# Patient Record
Sex: Female | Born: 1995 | Race: Black or African American | Hispanic: No | Smoking: Never smoker
Health system: Southern US, Community
[De-identification: ages and names within clinical notes are randomized; demographics above are authoritative.]

## PROBLEM LIST (undated history)

## (undated) DIAGNOSIS — Z789 Other specified health status: Secondary | ICD-10-CM

---

## 2020-03-02 ENCOUNTER — Emergency Department (HOSPITAL_COMMUNITY)
Admission: EM | Admit: 2020-03-02 | Discharge: 2020-03-03 | Disposition: A | Discharge status: Home / Self Care | Payer: Self-pay | Attending: Emergency Medicine | Admitting: Emergency Medicine

## 2020-03-02 ENCOUNTER — Other Ambulatory Visit: Payer: Self-pay

## 2020-03-02 DIAGNOSIS — R55 Syncope and collapse: Secondary | ICD-10-CM | POA: Insufficient documentation

## 2020-03-02 DIAGNOSIS — R1030 Lower abdominal pain, unspecified: Secondary | ICD-10-CM | POA: Insufficient documentation

## 2020-03-02 DIAGNOSIS — T61781A Other shellfish poisoning, accidental (unintentional), initial encounter: Secondary | ICD-10-CM

## 2020-03-02 DIAGNOSIS — R252 Cramp and spasm: Secondary | ICD-10-CM | POA: Insufficient documentation

## 2020-03-02 DIAGNOSIS — T782XXA Anaphylactic shock, unspecified, initial encounter: Secondary | ICD-10-CM | POA: Insufficient documentation

## 2020-03-02 MED ORDER — LACTATED RINGERS IV BOLUS
1000.0000 mL | Freq: Once | INTRAVENOUS | Status: AC
  Administered 2020-03-03: 1000 mL via INTRAVENOUS

## 2020-03-02 NOTE — ED Triage Notes (Signed)
Patient was transported here by EMS from a [place]. Started dinner had a salad, rice, and shrimp. Suddenly felt sick/ hot/numb. Near syncope. Patient was ill and reported by EMS very pale. Reports of abdominal pain and cramping.   -no hx of  allergies or any other medical conditions.  EMS: 20 gauge IV Lhand.  500cc N/S .3 epi 50mg  benadryl

## 2020-03-02 NOTE — ED Provider Notes (Signed)
[facility] Provider Note   CSN: [id number] Arrival date & time: 03/02/20  2300     History Chief Complaint  Patient presents with  . Allergic Reaction  . Near Syncope    Anna [name] is a 24 y.o. female.  HPI 24 year old female presents with near syncope.  History is via EMS and the patient.  She was eating at a [employer] where she was having shrimp and rice, to which she has no known allergies, and she started to feel hot and numb all over.  She felt like she was going to pass out.  Bystander states she did though she does not think so.  She never had a headache, chest pain, shortness of breath.  When in the ambulance she was having some abdominal cramping and was hypotensive in the 90s.  They gave her 50 mg IV Benadryl and 0.3 mg IM epi.  She is feeling better now. Denies any preceding illness or and significant PMH. No lip/tongue swelling or rash.  No past medical history on file.  There are no problems to display for this patient.      OB History   No obstetric history on file.     No family history on file.  Social History   Tobacco Use  . Smoking status: Not on file  Substance Use Topics  . Alcohol use: Not on file  . Drug use: Not on file    Home Medications Prior to Admission medications   Not on File    Allergies    Patient has no known allergies.  Review of Systems   Review of Systems  Respiratory: Negative for shortness of breath.   Cardiovascular: Negative for chest pain.  Gastrointestinal: Positive for abdominal pain. Negative for diarrhea and vomiting.  Skin: Negative for rash.  Neurological: Positive for light-headedness. Negative for syncope and headaches.  All other systems reviewed and are negative.   Physical Exam Updated Vital Signs BP 104/78   Pulse (!) 117   Temp 97.7 F (36.5 C) (Oral)   Resp (!) 24   Ht 4\' 11"  (1.499 m)   Wt 86.2 kg   SpO2 100%   BMI 38.38 kg/m   Physical  Exam Vitals and nursing note reviewed.  Constitutional:      General: She is not in acute distress.    Appearance: She is well-developed. She is obese. She is not ill-appearing or diaphoretic.  HENT:     Head: Normocephalic and atraumatic.     Right Ear: External ear normal.     Left Ear: External ear normal.     Nose: Nose normal.     Mouth/Throat:     Comments: No lip/tongue swelling.  Eyes:     General:        Right eye: No discharge.        Left eye: No discharge.     Pupils: Pupils are equal, round, and reactive to light.  Cardiovascular:     Rate and Rhythm: Regular rhythm. Tachycardia present.     Heart sounds: Normal heart sounds.  Pulmonary:     Effort: Pulmonary effort is normal.     Breath sounds: Normal breath sounds.  Abdominal:     Palpations: Abdomen is soft.     Tenderness: There is no abdominal tenderness.  Skin:    General: Skin is warm and dry.     Findings: No rash.  Neurological:     Mental Status: She is alert.  Comments: CN 3-12 grossly intact. 5/5 strength in all 4 extremities. Grossly normal sensation. Normal finger to nose.   Psychiatric:        Mood and Affect: Mood is not anxious.     ED Results / Procedures / Treatments   Labs (all labs ordered are listed, but only abnormal results are displayed) Labs Reviewed  COMPREHENSIVE METABOLIC PANEL - Abnormal; Notable for the following components:      Result Value   CO2 19 (*)    Glucose, Bld 115 (*)    Calcium 8.3 (*)    All other components within normal limits  CBC WITH DIFFERENTIAL/PLATELET  I-STAT BETA HCG BLOOD, ED ([facility], [facility], [facility] ONLY)    EKG EKG Interpretation  Date/Time:  Friday March 02 2020 23:51:09 EDT Ventricular Rate:  107 PR Interval:    QRS Duration: 83 QT Interval:  355 QTC Calculation: 474 R Axis:   1 Text Interpretation: Sinus tachycardia no acute ST/T changes No old tracing to compare Confirmed by Pricilla Loveless [phone]) on 03/03/2020 12:00:01 AM   Radiology No  results found.  Procedures Procedures (including critical care time)  Medications Ordered in ED Medications  lactated ringers bolus 1,000 mL (1,000 mLs Intravenous New Bag/Given 03/03/20 0001)    ED Course  I have reviewed the triage vital signs and the nursing notes.  Pertinent labs & imaging results that were available during my care of the patient were reviewed by me and considered in my medical decision making (see chart for details).    MDM Rules/Calculators/A&P                          Patient seems to have had a reaction to the seafood.  I doubt this was truly allergic but more likely toxin mediated.  After EMS treatment and then IV fluids in the emergency department and observation she is now feeling well enough for discharge.  She eats shrimp all the time according to her so I do not think this was truly anaphylaxis.  Will recommend continued Benadryl.  BP is now normal and her heart rate on my exam is in the 90s.  CBC with mild anemia but I doubt this was the cause of her near syncope episode.  She has a low bicarbonate but with normal anion gap I think this is unlikely to be pathologic at this time.  She appears stable for discharge.  Will give EpiPen given the degree of her reaction but again I do not think this is necessarily anaphylaxis. Final Clinical Impression(s) / ED Diagnoses Final diagnoses:  Accidental poisoning by shellfish, initial encounter    Rx / DC Orders ED Discharge Orders    None       Pricilla Loveless, MD 03/03/20 [phone]

## 2020-03-03 LAB — CBC WITH DIFFERENTIAL/PLATELET
Abs Immature Granulocytes: 0.02 10*3/uL (ref 0.00–0.07)
Basophils Absolute: 0.1 10*3/uL (ref 0.0–0.1)
Basophils Relative: 1 %
Eosinophils Absolute: 0.1 10*3/uL (ref 0.0–0.5)
Eosinophils Relative: 1 %
HCT: 33.4 % — ABNORMAL LOW (ref 36.0–46.0)
Hemoglobin: 11.1 g/dL — ABNORMAL LOW (ref 12.0–15.0)
Immature Granulocytes: 0 %
Lymphocytes Relative: 33 %
Lymphs Abs: 2.9 10*3/uL (ref 0.7–4.0)
MCH: 27.8 pg (ref 26.0–34.0)
MCHC: 33.2 g/dL (ref 30.0–36.0)
MCV: 83.7 fL (ref 80.0–100.0)
Monocytes Absolute: 1 10*3/uL (ref 0.1–1.0)
Monocytes Relative: 11 %
Neutro Abs: 4.9 10*3/uL (ref 1.7–7.7)
Neutrophils Relative %: 54 %
Platelets: 281 10*3/uL (ref 150–400)
RBC: 3.99 MIL/uL (ref 3.87–5.11)
RDW: 17 % — ABNORMAL HIGH (ref 11.5–15.5)
WBC: 8.9 10*3/uL (ref 4.0–10.5)
nRBC: 0 % (ref 0.0–0.2)

## 2020-03-03 LAB — COMPREHENSIVE METABOLIC PANEL
ALT: 24 U/L (ref 0–44)
AST: 27 U/L (ref 15–41)
Albumin: 3.8 g/dL (ref 3.5–5.0)
Alkaline Phosphatase: 61 U/L (ref 38–126)
Anion gap: 12 (ref 5–15)
BUN: 13 mg/dL (ref 6–20)
CO2: 19 mmol/L — ABNORMAL LOW (ref 22–32)
Calcium: 8.3 mg/dL — ABNORMAL LOW (ref 8.9–10.3)
Chloride: 107 mmol/L (ref 98–111)
Creatinine, Ser: 1 mg/dL (ref 0.44–1.00)
GFR calc Af Amer: >60 mL/min (ref >60)
GFR calc non Af Amer: >60 mL/min (ref >60)
Glucose, Bld: 115 mg/dL — ABNORMAL HIGH (ref 70–99)
Potassium: 3.9 mmol/L (ref 3.5–5.1)
Sodium: 138 mmol/L (ref 135–145)
Total Bilirubin: 0.6 mg/dL (ref 0.3–1.2)
Total Protein: 7.5 g/dL (ref 6.5–8.1)

## 2020-03-03 LAB — I-STAT BETA HCG BLOOD, ED (MC, WL, AP ONLY): I-stat hCG, quantitative: <5 m[IU]/mL (ref <5)

## 2020-03-03 MED ORDER — FAMOTIDINE IN NACL 20-0.9 MG/50ML-% IV SOLN: 20.0000 mg | Freq: Once | INTRAVENOUS | Status: DC

## 2020-03-03 MED ORDER — DIPHENHYDRAMINE HCL 25 MG PO TABS: 50.0000 mg | ORAL_TABLET | Freq: Four times a day (QID) | ORAL | 0 refills | Status: DC | PRN

## 2020-03-03 MED ORDER — EPINEPHRINE 0.3 MG/0.3ML IJ SOAJ: 0.3000 mg | INTRAMUSCULAR | 0 refills | Status: DC | PRN

## 2021-02-05 ENCOUNTER — Inpatient Hospital Stay (HOSPITAL_COMMUNITY)
Admission: EM | Admit: 2021-02-05 | Discharge: 2021-02-14 | DRG: 286 | Disposition: A | Discharge status: Home / Self Care | Payer: Self-pay | Attending: Internal Medicine | Admitting: Internal Medicine

## 2021-02-05 ENCOUNTER — Emergency Department (HOSPITAL_COMMUNITY): Payer: Self-pay

## 2021-02-05 ENCOUNTER — Encounter (HOSPITAL_COMMUNITY): Payer: Self-pay

## 2021-02-05 ENCOUNTER — Other Ambulatory Visit: Payer: Self-pay

## 2021-02-05 ENCOUNTER — Inpatient Hospital Stay (HOSPITAL_COMMUNITY): Payer: Self-pay

## 2021-02-05 DIAGNOSIS — I472 Ventricular tachycardia: Secondary | ICD-10-CM | POA: Diagnosis not present

## 2021-02-05 DIAGNOSIS — R0602 Shortness of breath: Secondary | ICD-10-CM

## 2021-02-05 DIAGNOSIS — I513 Intracardiac thrombosis, not elsewhere classified: Secondary | ICD-10-CM | POA: Diagnosis present

## 2021-02-05 DIAGNOSIS — I517 Cardiomegaly: Secondary | ICD-10-CM

## 2021-02-05 DIAGNOSIS — I5021 Acute systolic (congestive) heart failure: Secondary | ICD-10-CM | POA: Diagnosis present

## 2021-02-05 DIAGNOSIS — E669 Obesity, unspecified: Secondary | ICD-10-CM | POA: Diagnosis present

## 2021-02-05 DIAGNOSIS — F1010 Alcohol abuse, uncomplicated: Secondary | ICD-10-CM | POA: Diagnosis present

## 2021-02-05 DIAGNOSIS — I9589 Other hypotension: Secondary | ICD-10-CM | POA: Diagnosis not present

## 2021-02-05 DIAGNOSIS — I509 Heart failure, unspecified: Secondary | ICD-10-CM

## 2021-02-05 DIAGNOSIS — Z79899 Other long term (current) drug therapy: Secondary | ICD-10-CM

## 2021-02-05 DIAGNOSIS — Z20822 Contact with and (suspected) exposure to covid-19: Secondary | ICD-10-CM | POA: Diagnosis present

## 2021-02-05 DIAGNOSIS — Z6841 Body Mass Index (BMI) 40.0 and over, adult: Secondary | ICD-10-CM

## 2021-02-05 DIAGNOSIS — D649 Anemia, unspecified: Secondary | ICD-10-CM | POA: Diagnosis present

## 2021-02-05 DIAGNOSIS — R000 Tachycardia, unspecified: Secondary | ICD-10-CM

## 2021-02-05 DIAGNOSIS — I081 Rheumatic disorders of both mitral and tricuspid valves: Secondary | ICD-10-CM | POA: Diagnosis present

## 2021-02-05 DIAGNOSIS — I420 Dilated cardiomyopathy: Principal | ICD-10-CM | POA: Diagnosis present

## 2021-02-05 DIAGNOSIS — I4729 Other ventricular tachycardia: Secondary | ICD-10-CM

## 2021-02-05 HISTORY — DX: Other specified health status: Z78.9

## 2021-02-05 LAB — ECHOCARDIOGRAM COMPLETE
Area-P 1/2: 5.84 cm2
Calc EF: 15 %
Height: 59 in
MV M vel: 4.61 m/s
MV Peak grad: 85 mmHg
Radius: 0.5 cm
S' Lateral: 5.7 cm
Single Plane A2C EF: 21.9 %
Single Plane A4C EF: 13.1 %
Weight: 3136 oz

## 2021-02-05 LAB — TROPONIN I (HIGH SENSITIVITY)
Troponin I (High Sensitivity): 15 ng/L (ref <18)
Troponin I (High Sensitivity): 14 ng/L (ref <18)

## 2021-02-05 LAB — BLOOD GAS, ARTERIAL
Acid-base deficit: 0.9 mmol/L (ref 0.0–2.0)
Bicarbonate: 21.8 mmol/L (ref 20.0–28.0)
FIO2: 21
O2 Saturation: 91.4 %
Patient temperature: 98.2
pCO2 arterial: 30.5 mmHg — ABNORMAL LOW (ref 32.0–48.0)
pH, Arterial: 7.466 — ABNORMAL HIGH (ref 7.350–7.450)
pO2, Arterial: 60.1 mmHg — ABNORMAL LOW (ref 83.0–108.0)

## 2021-02-05 LAB — URINALYSIS, ROUTINE W REFLEX MICROSCOPIC
Bilirubin Urine: NEGATIVE
Glucose, UA: NEGATIVE mg/dL
Hgb urine dipstick: NEGATIVE
Ketones, ur: NEGATIVE mg/dL
Leukocytes,Ua: NEGATIVE
Nitrite: NEGATIVE
Protein, ur: NEGATIVE mg/dL
Specific Gravity, Urine: <1.005 — ABNORMAL LOW (ref 1.005–1.030)
pH: 6 (ref 5.0–8.0)

## 2021-02-05 LAB — CBC WITH DIFFERENTIAL/PLATELET
Abs Immature Granulocytes: 0.02 10*3/uL (ref 0.00–0.07)
Basophils Absolute: 0 10*3/uL (ref 0.0–0.1)
Basophils Relative: 1 %
Eosinophils Absolute: 0.1 10*3/uL (ref 0.0–0.5)
Eosinophils Relative: 1 %
HCT: 33.4 % — ABNORMAL LOW (ref 36.0–46.0)
Hemoglobin: 11.1 g/dL — ABNORMAL LOW (ref 12.0–15.0)
Immature Granulocytes: 0 %
Lymphocytes Relative: 37 %
Lymphs Abs: 2.7 10*3/uL (ref 0.7–4.0)
MCH: 28.2 pg (ref 26.0–34.0)
MCHC: 33.2 g/dL (ref 30.0–36.0)
MCV: 85 fL (ref 80.0–100.0)
Monocytes Absolute: 0.7 10*3/uL (ref 0.1–1.0)
Monocytes Relative: 10 %
Neutro Abs: 3.8 10*3/uL (ref 1.7–7.7)
Neutrophils Relative %: 51 %
Platelets: 244 10*3/uL (ref 150–400)
RBC: 3.93 MIL/uL (ref 3.87–5.11)
RDW: 17.9 % — ABNORMAL HIGH (ref 11.5–15.5)
WBC: 7.4 10*3/uL (ref 4.0–10.5)
nRBC: 0 % (ref 0.0–0.2)

## 2021-02-05 LAB — D-DIMER, QUANTITATIVE: D-Dimer, Quant: 2.48 ug/mL-FEU — ABNORMAL HIGH (ref 0.00–0.50)

## 2021-02-05 LAB — HEPATIC FUNCTION PANEL
ALT: 22 U/L (ref 0–44)
AST: 21 U/L (ref 15–41)
Albumin: 3.6 g/dL (ref 3.5–5.0)
Alkaline Phosphatase: 64 U/L (ref 38–126)
Bilirubin, Direct: 0.1 mg/dL (ref 0.0–0.2)
Indirect Bilirubin: 0.4 mg/dL (ref 0.3–0.9)
Total Bilirubin: 0.5 mg/dL (ref 0.3–1.2)
Total Protein: 7.1 g/dL (ref 6.5–8.1)

## 2021-02-05 LAB — BASIC METABOLIC PANEL
Anion gap: 7 (ref 5–15)
BUN: 10 mg/dL (ref 6–20)
CO2: 21 mmol/L — ABNORMAL LOW (ref 22–32)
Calcium: 8.8 mg/dL — ABNORMAL LOW (ref 8.9–10.3)
Chloride: 109 mmol/L (ref 98–111)
Creatinine, Ser: 1.12 mg/dL — ABNORMAL HIGH (ref 0.44–1.00)
GFR, Estimated: >60 mL/min (ref >60)
Glucose, Bld: 113 mg/dL — ABNORMAL HIGH (ref 70–99)
Potassium: 3.7 mmol/L (ref 3.5–5.1)
Sodium: 137 mmol/L (ref 135–145)

## 2021-02-05 LAB — RAPID URINE DRUG SCREEN, HOSP PERFORMED
Amphetamines: NOT DETECTED
Barbiturates: NOT DETECTED
Benzodiazepines: NOT DETECTED
Cocaine: NOT DETECTED
Opiates: NOT DETECTED
Tetrahydrocannabinol: POSITIVE — AB

## 2021-02-05 LAB — TSH: TSH: 1.95 u[IU]/mL (ref 0.350–4.500)

## 2021-02-05 LAB — BRAIN NATRIURETIC PEPTIDE: B Natriuretic Peptide: 506.5 pg/mL — ABNORMAL HIGH (ref 0.0–100.0)

## 2021-02-05 LAB — HEPARIN LEVEL (UNFRACTIONATED): Heparin Unfractionated: 0.79 IU/mL — ABNORMAL HIGH (ref 0.30–0.70)

## 2021-02-05 LAB — RESP PANEL BY RT-PCR (FLU A&B, COVID) ARPGX2
Influenza A by PCR: NEGATIVE
Influenza B by PCR: NEGATIVE
SARS Coronavirus 2 by RT PCR: NEGATIVE

## 2021-02-05 LAB — MAGNESIUM: Magnesium: 1.8 mg/dL (ref 1.7–2.4)

## 2021-02-05 LAB — HCG, QUANTITATIVE, PREGNANCY: hCG, Beta Chain, Quant, S: <1 m[IU]/mL (ref <5)

## 2021-02-05 MED ORDER — HEPARIN BOLUS VIA INFUSION
2000.0000 [IU] | Freq: Once | INTRAVENOUS | Status: DC
  Filled 2021-02-05: qty 2000

## 2021-02-05 MED ORDER — SODIUM CHLORIDE 0.9 % IV SOLN: 250.0000 mL | INTRAVENOUS | Status: DC | PRN

## 2021-02-05 MED ORDER — HEPARIN (PORCINE) 25000 UT/250ML-% IV SOLN
1250.0000 [IU]/h | INTRAVENOUS | Status: DC
  Filled 2021-02-05: qty 250

## 2021-02-05 MED ORDER — POTASSIUM CHLORIDE CRYS ER 20 MEQ PO TBCR
30.0000 meq | EXTENDED_RELEASE_TABLET | Freq: Once | ORAL | Status: AC
  Administered 2021-02-05: 30 meq via ORAL
  Filled 2021-02-05: qty 1

## 2021-02-05 MED ORDER — SODIUM CHLORIDE 0.9% FLUSH
3.0000 mL | Freq: Two times a day (BID) | INTRAVENOUS | Status: DC
  Administered 2021-02-05 – 2021-02-06 (×3): 3 mL via INTRAVENOUS

## 2021-02-05 MED ORDER — SODIUM CHLORIDE 0.9% FLUSH
3.0000 mL | INTRAVENOUS | Status: DC | PRN
  Administered 2021-02-06: 3 mL via INTRAVENOUS

## 2021-02-05 MED ORDER — HEPARIN (PORCINE) 25000 UT/250ML-% IV SOLN
1350.0000 [IU]/h | INTRAVENOUS | Status: DC
  Administered 2021-02-05: 1350 [IU]/h via INTRAVENOUS
  Filled 2021-02-05 (×2): qty 250

## 2021-02-05 MED ORDER — ACETAMINOPHEN 325 MG PO TABS
650.0000 mg | ORAL_TABLET | ORAL | Status: DC | PRN
  Administered 2021-02-06 – 2021-02-13 (×2): 650 mg via ORAL
  Filled 2021-02-05 (×3): qty 2

## 2021-02-05 MED ORDER — FUROSEMIDE 10 MG/ML IJ SOLN
40.0000 mg | Freq: Once | INTRAMUSCULAR | Status: DC
Start: 2021-02-05 — End: 2021-02-05

## 2021-02-05 MED ORDER — PERFLUTREN LIPID MICROSPHERE
1.0000 mL | INTRAVENOUS | Status: AC | PRN
  Administered 2021-02-05: 2 mL via INTRAVENOUS
  Filled 2021-02-05: qty 10

## 2021-02-05 MED ORDER — SACUBITRIL-VALSARTAN 24-26 MG PO TABS
1.0000 | ORAL_TABLET | Freq: Two times a day (BID) | ORAL | Status: DC
  Administered 2021-02-05 – 2021-02-08 (×8): 1 via ORAL
  Filled 2021-02-05 (×9): qty 1

## 2021-02-05 MED ORDER — CARVEDILOL 3.125 MG PO TABS
3.1250 mg | ORAL_TABLET | Freq: Two times a day (BID) | ORAL | Status: DC
  Administered 2021-02-05 – 2021-02-08 (×6): 3.125 mg via ORAL
  Filled 2021-02-05 (×6): qty 1

## 2021-02-05 MED ORDER — ENOXAPARIN SODIUM 40 MG/0.4ML IJ SOSY
40.0000 mg | PREFILLED_SYRINGE | INTRAMUSCULAR | Status: DC
  Administered 2021-02-05: 40 mg via SUBCUTANEOUS
  Filled 2021-02-05: qty 0.4

## 2021-02-05 MED ORDER — FUROSEMIDE 10 MG/ML IJ SOLN
40.0000 mg | Freq: Two times a day (BID) | INTRAMUSCULAR | Status: DC
  Administered 2021-02-05 – 2021-02-06 (×3): 40 mg via INTRAVENOUS
  Filled 2021-02-05 (×3): qty 4

## 2021-02-05 MED ORDER — ONDANSETRON HCL 4 MG/2ML IJ SOLN
4.0000 mg | Freq: Four times a day (QID) | INTRAMUSCULAR | Status: DC | PRN
  Administered 2021-02-05: 4 mg via INTRAVENOUS
  Filled 2021-02-05: qty 2

## 2021-02-05 MED ORDER — IOHEXOL 350 MG/ML SOLN
100.0000 mL | Freq: Once | INTRAVENOUS | Status: AC | PRN
  Administered 2021-02-05: 100 mL via INTRAVENOUS

## 2021-02-05 MED ORDER — FUROSEMIDE 10 MG/ML IJ SOLN
40.0000 mg | Freq: Once | INTRAMUSCULAR | Status: AC
  Administered 2021-02-05: 40 mg via INTRAVENOUS
  Filled 2021-02-05: qty 4

## 2021-02-05 MED ORDER — MAGNESIUM SULFATE 2 GM/50ML IV SOLN
2.0000 g | Freq: Once | INTRAVENOUS | Status: AC
  Administered 2021-02-05: 2 g via INTRAVENOUS
  Filled 2021-02-05: qty 50

## 2021-02-05 NOTE — Progress Notes (Addendum)
ANTICOAGULATION CONSULT NOTE - Initial Consult  Pharmacy Consult for IV Heparin Indication:  LV thrombus  No Known Allergies  Patient Measurements: Height: 4\' 11"  (149.9 cm) Weight: 88.9 kg (196 lb) IBW/kg (Calculated) : 43.2 Heparin Dosing Weight: 64.5 kg  Vital Signs: Temp: 97.6 F (36.4 C) (09/06 1317) Temp Source: Oral (09/06 1317) BP: 119/84 (09/06 1317) Pulse Rate: 120 (09/06 1317)  Labs: Recent Labs    02/05/21 0219 02/05/21 0314 02/05/21 0621  HGB 11.1*  --   --   HCT 33.4*  --   --   PLT 244  --   --   CREATININE 1.12*  --   --   TROPONINIHS  --  15 14    Estimated Creatinine Clearance: 74.5 mL/min (A) (by C-G formula based on SCr of 1.12 mg/dL (H)).   Medical History: Past Medical History:  Diagnosis Date   Medical history non-contributory     Medications:  Scheduled:   furosemide  40 mg Intravenous Q12H   sodium chloride flush  3 mL Intravenous Q12H   Infusions:   sodium chloride     PRN: sodium chloride, acetaminophen, ondansetron (ZOFRAN) IV, perflutren lipid microspheres (DEFINITY) IV suspension, sodium chloride flush  Assessment: 25 yo female presented to ED with cough and shortness of breath found to have new onset HFrEF and now LV thrombus to start IV heparin. Baseline labs obtained  Goal of Therapy:  Heparin level 0.3-0.7 units/ml Monitor platelets by anticoagulation protocol: Yes   Plan:  Lovenox 40mg  given this AM - will not give heparin bolus as a result Start IV heparin at rate of 1350 units/hr Check heparin level 6 hours after heparin started Daily CBC  22 02/05/2021,1:18 PM

## 2021-02-05 NOTE — ED Provider Notes (Signed)
[facility]-EMERGENCY DEPT Provider Note: Lowella Dell, MD, FACEP  CSN: [id number] MRN: [id number] ARRIVAL: 02/05/21 at 0208 ROOM: WA18/WA18   CHIEF COMPLAINT  Shortness of Breath   HISTORY OF PRESENT ILLNESS  02/05/21 2:23 AM Anna [name] is a 25 y.o. female who has had shortness of breath for 4 to 5 days with a nonproductive cough.  Symptoms are moderate to severe, worse when lying supine and improved when sitting upright.  She has not had a fever, nasal congestion, sore throat, nausea or vomiting.  She has had some diarrhea.  She denies loss of taste or smell.  Her oxygen saturation was anywhere from 88% to 98% on room air while in triage but primarily stayed at 90%.  She was noted to be significantly tachycardic as well.  She has had some pain in her right popliteal fossa.  She is having no frank chest pain but does have a heaviness sensation anteriorly.   History reviewed. No pertinent past medical history.  History reviewed. No pertinent surgical history.  No family history on file.  Social History   Tobacco Use   Smoking status: Never   Smokeless tobacco: Never    Prior to Admission medications   Medication Sig Start Date End Date Taking? Authorizing Provider  diphenhydrAMINE (BENADRYL) 25 MG tablet Take 2 tablets (50 mg total) by mouth every 6 (six) hours as needed. 03/03/20   Pricilla Loveless, MD  EPINEPHrine 0.3 mg/0.3 mL IJ SOAJ injection Inject 0.3 mg into the muscle as needed for anaphylaxis. 03/03/20   Pricilla Loveless, MD    Allergies Patient has no known allergies.   REVIEW OF SYSTEMS  Negative except as noted here or in the History of Present Illness.   PHYSICAL EXAMINATION  Initial Vital Signs Blood pressure 118/76, pulse (!) 137, temperature 98.2 F (36.8 C), temperature source Oral, resp. rate 19, height 4\' 11"  (1.499 m), weight 88.9 kg, SpO2 90 %.  Examination General: Well-developed, well-nourished female in no acute distress; appearance consistent with  age of record HENT: normocephalic; atraumatic Eyes: Normal appearance Neck: supple Heart: regular rate and rhythm; tachycardia Lungs: No tachypnea; no accessory muscle use; mildly decreased air movement bilaterally Abdomen: soft; nondistended; nontender; bowel sounds present Extremities: No deformity; full range of motion; no edema Neurologic: Awake, alert and oriented; motor function intact in all extremities and symmetric; no facial droop Skin: Warm and dry Psychiatric: Normal mood and affect   RESULTS  Summary of this visit's results, reviewed and interpreted by myself:   EKG Interpretation  Date/Time:  Tuesday February 05 2021 02:34:46 EDT Ventricular Rate:  127 PR Interval:  126 QRS Duration: 87 QT Interval:  324 QTC Calculation: 471 R Axis:     Text Interpretation: Sinus tachycardia Rate is faster Confirmed by Kaspian Muccio, 08-21-1997 (Jonny Ruiz) on 02/05/2021 2:42:50 AM       Laboratory Studies: Results for orders placed or performed during the hospital encounter of 02/05/21 (from the past 24 hour(s))  CBC with Differential/Platelet     Status: Abnormal   Collection Time: 02/05/21  2:19 AM  Result Value Ref Range   WBC 7.4 4.0 - 10.5 K/uL   RBC 3.93 3.87 - 5.11 MIL/uL   Hemoglobin 11.1 (L) 12.0 - 15.0 g/dL   HCT 04/07/21 (L) 02.6 - 37.8 %   MCV 85.0 80.0 - 100.0 fL   MCH 28.2 26.0 - 34.0 pg   MCHC 33.2 30.0 - 36.0 g/dL   RDW 58.8 (H) 50.2 - 77.4 %   Platelets  244 150 - 400 K/uL   nRBC 0.0 0.0 - 0.2 %   Neutrophils Relative % 51 %   Neutro Abs 3.8 1.7 - 7.7 K/uL   Lymphocytes Relative 37 %   Lymphs Abs 2.7 0.7 - 4.0 K/uL   Monocytes Relative 10 %   Monocytes Absolute 0.7 0.1 - 1.0 K/uL   Eosinophils Relative 1 %   Eosinophils Absolute 0.1 0.0 - 0.5 K/uL   Basophils Relative 1 %   Basophils Absolute 0.0 0.0 - 0.1 K/uL   Immature Granulocytes 0 %   Abs Immature Granulocytes 0.02 0.00 - 0.07 K/uL  Basic metabolic panel     Status: Abnormal   Collection Time: 02/05/21  2:19 AM   Result Value Ref Range   Sodium 137 135 - 145 mmol/L   Potassium 3.7 3.5 - 5.1 mmol/L   Chloride 109 98 - 111 mmol/L   CO2 21 (L) 22 - 32 mmol/L   Glucose, Bld 113 (H) 70 - 99 mg/dL   BUN 10 6 - 20 mg/dL   Creatinine, Ser 3.79 (H) 0.44 - 1.00 mg/dL   Calcium 8.8 (L) 8.9 - 10.3 mg/dL   GFR, Estimated >02 >40 mL/min   Anion gap 7 5 - 15  Resp Panel by RT-PCR (Flu A&B, Covid)     Status: None   Collection Time: 02/05/21  2:53 AM  Result Value Ref Range   SARS Coronavirus 2 by RT PCR NEGATIVE NEGATIVE   Influenza A by PCR NEGATIVE NEGATIVE   Influenza B by PCR NEGATIVE NEGATIVE  Blood gas, arterial (at [facility])     Status: Abnormal   Collection Time: 02/05/21  2:54 AM  Result Value Ref Range   FIO2 21.00    pH, Arterial 7.466 (H) 7.350 - 7.450   pCO2 arterial 30.5 (L) 32.0 - 48.0 mmHg   pO2, Arterial 60.1 (L) 83.0 - 108.0 mmHg   Bicarbonate 21.8 20.0 - 28.0 mmol/L   Acid-base deficit 0.9 0.0 - 2.0 mmol/L   O2 Saturation 91.4 %   Patient temperature 98.2    Allens test (pass/fail) PASS PASS  Brain natriuretic peptide     Status: Abnormal   Collection Time: 02/05/21  3:14 AM  Result Value Ref Range   B Natriuretic Peptide 506.5 (H) 0.0 - 100.0 pg/mL  Troponin I (High Sensitivity)     Status: None   Collection Time: 02/05/21  3:14 AM  Result Value Ref Range   Troponin I (High Sensitivity) 15 <18 ng/L  D-dimer, quantitative     Status: Abnormal   Collection Time: 02/05/21  3:14 AM  Result Value Ref Range   D-Dimer, Quant 2.48 (H) 0.00 - 0.50 ug/mL-FEU  hCG, quantitative, pregnancy     Status: None   Collection Time: 02/05/21  3:14 AM  Result Value Ref Range   hCG, Beta Chain, Quant, S <1 <5 mIU/mL   Imaging Studies: DG Chest 2 View  Result Date: 02/05/2021 CLINICAL DATA:  Shortness of breath EXAM: CHEST - 2 VIEW COMPARISON:  None. FINDINGS: Cardiomegaly. Diffuse bilateral airspace disease. No effusions. No acute bony abnormality. IMPRESSION: Cardiomegaly with diffuse  bilateral airspace disease concerning for edema. Electronically Signed   By: Charlett Nose M.D.   On: 02/05/2021 02:53   CT Angio Chest PE W and/or Wo Contrast  Result Date: 02/05/2021 CLINICAL DATA:  Cough and shortness of breath for the past 4-5 days. Elevated D-dimer. EXAM: CT ANGIOGRAPHY CHEST WITH CONTRAST TECHNIQUE: Multidetector CT imaging of the  chest was performed using the standard protocol during bolus administration of intravenous contrast. Multiplanar CT image reconstructions and MIPs were obtained to evaluate the vascular anatomy. CONTRAST:  OMNIPAQUE IOHEXOL 350 MG/ML SOLN COMPARISON:  Chest x-ray from same day. FINDINGS: Cardiovascular: Satisfactory opacification of the pulmonary arteries to the segmental level. No evidence of pulmonary embolism. Cardiomegaly. No pericardial effusion. No thoracic aortic aneurysm. Mediastinum/Nodes: No enlarged mediastinal, hilar, or axillary lymph nodes. Thyroid gland, trachea, and esophagus demonstrate no significant findings. Lungs/Pleura: Scattered smooth interlobular septal thickening. Patchy irregular ground-glass densities in both upper lobes, and to a lesser extent in the right lower lobe. Small right and trace left pleural effusions. Small amount of fluid along the left major fissure. No pneumothorax. Upper Abdomen: No acute abnormality. Musculoskeletal: No chest wall abnormality. No acute or significant osseous findings. Review of the MIP images confirms the above findings. IMPRESSION: 1. No evidence of pulmonary embolism. 2. Congestive heart failure. Mild interstitial and alveolar pulmonary edema with small right and trace left pleural effusions. Electronically Signed   By: Obie Dredge M.D.   On: 02/05/2021 05:20    ED COURSE and MDM  Nursing notes, initial and subsequent vitals signs, including pulse oximetry, reviewed and interpreted by myself.  Vitals:   02/05/21 0256 02/05/21 0354 02/05/21 0400 02/05/21 0445  BP: 105/71 125/71 108/70  106/63  Pulse: (!) 131 (!) 127 (!) 119 (!) 138  Resp: 18 (!) 22 18 13   Temp:      TempSrc:      SpO2: 94% 95% 95% 95%  Weight:      Height:       Medications  furosemide (LASIX) injection 40 mg (has no administration in time range)  iohexol (OMNIPAQUE) 350 MG/ML injection 100 mL (100 mLs Intravenous Contrast Given 02/05/21 0417)   5:27 AM CT and BMP are consistent with congestive heart failure.  The patient also has cardiomegaly.  She has no history of heart disease so this could represent a new onset cardiomyopathy.  The cause of the sinus tachycardia is unclear as well.  We will start gentle diuresis and have her admitted for further work-up.  Dr. 04/07/21 to admit, and agrees with Lasix administration.   PROCEDURES  Procedures   ED DIAGNOSES     ICD-10-CM   1. New onset of congestive heart failure (HCC)  I50.9     2. Cardiomegaly  I51.7     3. Sinus tachycardia  R00.0          Wilson Dusenbery, Antionette Char, MD 02/05/21 [phone]

## 2021-02-05 NOTE — Progress Notes (Signed)
PROGRESS NOTE  Anna [name]  DOB: 05-10-1996  PCP: Patient, No Pcp Per (Inactive) WNU:[id number]  DOA: 02/05/2021  LOS: 0 days  Hospital Day: 1   Chief Complaint  Patient presents with   Shortness of Breath    Brief narrative: Anna [name] is a 25 y.o. female with no significant past medical history who presented to the ED last night with complaint of 4 to 5 days of nonproductive cough, shortness of breath, palpitation.  She had an episode of fever, chills, generalized aches, and sore throat on August 19 after exposure to her stepdad who had COVID though she was not tested.  She recovered from that illness before the current symptoms developed insidiously over the past few days.    In the ED, patient was afebrile, oxygen saturation low 90s, mildly tachypneic, tachycardic to 130s with blood pressure 130/67.   EKG features sinus tachycardia with rate 127.   Chest x-ray showed cardiomegaly and diffuse bilateral airspace opacities concerning for edema.  CTA chest was negative for PE but notable for mild interstitial and alveolar pulmonary edema and small right pleural effusion.   Chemistry panel with potassium 3.7 and creatinine 1.12.   CBC with slight normocytic anemia.   COVID and influenza PCR negative.   High-sensitivity troponin is normal and BNP 507.   Patient was given 40 mg IV Lasix in the ED. Admitted to hospitalist service  Subjective: Patient was seen and examined this morning.  Pleasant young African-American female.  Lying down in bed.  Not in distress.  She was getting echocardiogram done.  Assessment/Plan: New-onset CHF  -Presented with 4 to 5 days of progressive shortness of breath, nonproductive cough, palpitation.  Chest x-ray with pulm edema, BNP elevated. -Echocardiogram with EF less than 20%  -Currently on IV Lasix. -Cardiology consultation called.  May need ischemic evaluation.  Defer to cardiology. -Noted that cardiology has also started the patient on Entresto  and Coreg.  LV mural thrombus -Possibility of LV mural thrombus noted in echocardiogram. -Discussed with cardiology Dr. Algie Coffer.  Started on heparin drip.  Mobility: Encourage ambulation Code Status:   Code Status: Full Code  Nutritional status: Body mass index is 39.59 kg/m.     Diet:  Diet Order             Diet Heart Room service appropriate? Yes; Fluid consistency: Thin; Fluid restriction: 1500 mL Fluid  Diet effective now                  DVT prophylaxis: Heparin drip   Antimicrobials: None Fluid: None Consultants: Cardiology Family Communication: None at bedside  Status is: Inpatient  Remains inpatient appropriate because: Needs IV Lasix, may need cath  Dispo: The patient is from: Home              Anticipated d/c is to: Home              Patient currently is not medically stable to d/c.   Difficult to place patient No     Infusions:   sodium chloride     heparin 1,350 Units/hr (02/05/21 1450)    Scheduled Meds:  carvedilol  3.125 mg Oral BID WC   furosemide  40 mg Intravenous Q12H   sacubitril-valsartan  1 tablet Oral BID   sodium chloride flush  3 mL Intravenous Q12H    Antimicrobials: Anti-infectives (From admission, onward)    None       PRN meds: sodium chloride, acetaminophen, ondansetron (ZOFRAN) IV, sodium chloride  flush   Objective: Vitals:   02/05/21 1053 02/05/21 1317  BP: 99/68 119/84  Pulse: (!) 111 (!) 120  Resp: 20 18  Temp: 98.7 F (37.1 C) 97.6 F (36.4 C)  SpO2:  99%    Intake/Output Summary (Last 24 hours) at 02/05/2021 1637 Last data filed at 02/05/2021 1003 Gross per 24 hour  Intake 50 ml  Output --  Net 50 ml   Filed Weights   02/05/21 0219  Weight: 88.9 kg   Weight change:  Body mass index is 39.59 kg/m.   Physical Exam: General exam: Pleasant young African-American female.  Not in physical distress Skin: No rashes, lesions or ulcers. HEENT: Atraumatic, normocephalic, no obvious bleeding Lungs:  Clear to auscultation bilaterally CVS: Regular rate and rhythm, no murmur GI/Abd soft, nontender, nondistended, bowel sound present CNS: Alert, awake, oriented x3 Psychiatry: Mood appropriate Extremities: No pedal edema, no calf tenderness  Data Review: I have personally reviewed the laboratory data and studies available.  Recent Labs  Lab 02/05/21 0219  WBC 7.4  NEUTROABS 3.8  HGB 11.1*  HCT 33.4*  MCV 85.0  PLT 244   Recent Labs  Lab 02/05/21 0219 02/05/21 0314  NA 137  --   K 3.7  --   CL 109  --   CO2 21*  --   GLUCOSE 113*  --   BUN 10  --   CREATININE 1.12*  --   CALCIUM 8.8*  --   MG  --  1.8    F/u labs ordered Unresulted Labs (From admission, onward)     Start     Ordered   02/06/21 0500  Basic metabolic panel  Daily,   R      02/05/21 0614   02/06/21 0500  Magnesium  Tomorrow morning,   R        02/05/21 0614   02/06/21 0500  CBC  Daily,   R      02/05/21 1324   02/05/21 2000  Heparin level (unfractionated)  Once-Timed,   TIMED        02/05/21 1324   02/05/21 0613  HIV Antibody (routine testing w rflx)  (HIV Antibody (Routine testing w reflex) panel)  Once,   STAT        02/05/21 2025            Signed, Lorin Glass, MD [facility] 02/05/2021

## 2021-02-05 NOTE — ED Triage Notes (Signed)
Pt reports a dry nonproductive cough and shortness of breath x 4-5 days. 02 sats 90-91% on room air.

## 2021-02-05 NOTE — H&P (Signed)
History and Physical    Anna [name] ZOX:[id number] DOB: 11-22-1995 DOA: 02/05/2021  PCP: Patient, No Pcp Per (Inactive)   Patient coming from: home   Chief Complaint: SOB, nonproductive cough   HPI: Anna [name] is a 25 y.o. female with BMI 40 who denies any significant past medical history and now presents emergency department with 4 to 5 days of nonproductive cough, shortness of breath, and palpitations.  She reports some bilateral foot swelling in the past but not recently.  She had never experienced any symptoms previously.  She had an episode of fever, chills, generalized aches, and sore throat on August 19 after exposure to her stepdad who had COVID though she was not tested.  She recovered from that illness before the current symptoms developed insidiously over the past few days.  No recent pregnancy, denies any excessive alcohol use or drug use, and no chest pain.  ED Course: Upon arrival to the ED, patient is found to be afebrile, saturating low 90s on room air, mildly tachypneic, tachycardic as high as 130s, and with blood pressure 130/67.  EKG features sinus tachycardia with rate 127.  Chest x-ray with cardiomegaly and diffuse bilateral airspace opacities concerning for edema.  CTA chest is negative for PE but notable for mild interstitial and alveolar pulmonary edema and small right pleural effusion.  Chemistry panel with potassium 3.7 and creatinine 1.12.  CBC with slight normocytic anemia.  COVID and influenza PCR negative.  High-sensitivity troponin is normal and BNP 507.  Patient was given 40 mg IV Lasix in the ED.  Review of Systems:  All other systems reviewed and apart from HPI, are negative.  Past Medical History:  Diagnosis Date   Medical history non-contributory     History reviewed. No pertinent surgical history.  Social History:   reports that she has never smoked. She has never used smokeless tobacco. She reports current alcohol use. She reports that she does not  use drugs.  No Known Allergies  Family History  Problem Relation Age of Onset   Cancer Maternal Great-grandmother      Prior to Admission medications   Medication Sig Start Date End Date Taking? Authorizing Provider  diphenhydrAMINE (BENADRYL) 25 MG tablet Take 2 tablets (50 mg total) by mouth every 6 (six) hours as needed. 03/03/20   Pricilla Loveless, MD  EPINEPHrine 0.3 mg/0.3 mL IJ SOAJ injection Inject 0.3 mg into the muscle as needed for anaphylaxis. 03/03/20   Pricilla Loveless, MD    Physical Exam: Vitals:   02/05/21 0354 02/05/21 0400 02/05/21 0445 02/05/21 0500  BP: 125/71 108/70 106/63 113/67  Pulse: (!) 127 (!) 119 (!) 138 (!) 119  Resp: (!) 22 18 13  (!) 30  Temp:      TempSrc:      SpO2: 95% 95% 95% 94%  Weight:      Height:        Constitutional: NAD, no diaphoresis  Eyes: PERTLA, lids and conjunctivae normal ENMT: Mucous membranes are moist. Posterior pharynx clear of any exudate or lesions.   Neck: supple, no masses  Respiratory: Dyspnea with speech, fine rales. No cyanosis.  Cardiovascular: Rate ~120 and regular. No extremity edema.  Abdomen: No distension, no tenderness, soft. Bowel sounds active.  Musculoskeletal: no clubbing / cyanosis. No joint deformity upper and lower extremities.   Skin: no significant rashes, lesions, ulcers. Warm, dry, well-perfused. Neurologic: CN 2-12 grossly intact. Sensation intact. Moving all extremities.  Psychiatric: Alert and oriented to person, place, and situation. Anxious,  cooperative.    Labs and Imaging on Admission: I have personally reviewed following labs and imaging studies  CBC: Recent Labs  Lab 02/05/21 0219  WBC 7.4  NEUTROABS 3.8  HGB 11.1*  HCT 33.4*  MCV 85.0  PLT 244   Basic Metabolic Panel: Recent Labs  Lab 02/05/21 0219  NA 137  K 3.7  CL 109  CO2 21*  GLUCOSE 113*  BUN 10  CREATININE 1.12*  CALCIUM 8.8*   GFR: Estimated Creatinine Clearance: 74.5 mL/min (A) (by C-G formula based on SCr  of 1.12 mg/dL (H)). Liver Function Tests: No results for input(s): AST, ALT, ALKPHOS, BILITOT, PROT, ALBUMIN in the last 168 hours. No results for input(s): LIPASE, AMYLASE in the last 168 hours. No results for input(s): AMMONIA in the last 168 hours. Coagulation Profile: No results for input(s): INR, PROTIME in the last 168 hours. Cardiac Enzymes: No results for input(s): CKTOTAL, CKMB, CKMBINDEX, TROPONINI in the last 168 hours. BNP (last 3 results) No results for input(s): PROBNP in the last 8760 hours. HbA1C: No results for input(s): HGBA1C in the last 72 hours. CBG: No results for input(s): GLUCAP in the last 168 hours. Lipid Profile: No results for input(s): CHOL, HDL, LDLCALC, TRIG, CHOLHDL, LDLDIRECT in the last 72 hours. Thyroid Function Tests: No results for input(s): TSH, T4TOTAL, FREET4, T3FREE, THYROIDAB in the last 72 hours. Anemia Panel: No results for input(s): VITAMINB12, FOLATE, FERRITIN, TIBC, IRON, RETICCTPCT in the last 72 hours. Urine analysis: No results found for: COLORURINE, APPEARANCEUR, LABSPEC, PHURINE, GLUCOSEU, HGBUR, BILIRUBINUR, KETONESUR, PROTEINUR, UROBILINOGEN, NITRITE, LEUKOCYTESUR Sepsis Labs: @LABRCNTIP (procalcitonin:4,lacticidven:4) ) Recent Results (from the past 240 hour(s))  Resp Panel by RT-PCR (Flu A&B, Covid)     Status: None   Collection Time: 02/05/21  2:53 AM  Result Value Ref Range Status   SARS Coronavirus 2 by RT PCR NEGATIVE NEGATIVE Final    Comment: (NOTE) SARS-CoV-2 target nucleic acids are NOT DETECTED.  The SARS-CoV-2 RNA is generally detectable in upper respiratory specimens during the acute phase of infection. The lowest concentration of SARS-CoV-2 viral copies this assay can detect is 138 copies/mL. A negative result does not preclude SARS-Cov-2 infection and should not be used as the sole basis for treatment or other patient management decisions. A negative result may occur with  improper specimen  collection/handling, submission of specimen other than nasopharyngeal swab, presence of viral mutation(s) within the areas targeted by this assay, and inadequate number of viral copies(<138 copies/mL). A negative result must be combined with clinical observations, patient history, and epidemiological information. The expected result is Negative.  Fact Sheet for Patients:  04/07/21  Fact Sheet for Healthcare Providers:  BloggerCourse.com  This test is no t yet approved or cleared by the [place] FDA and  has been authorized for detection and/or diagnosis of SARS-CoV-2 by FDA under an Emergency Use Authorization (EUA). This EUA will remain  in effect (meaning this test can be used) for the duration of the COVID-19 declaration under Section 564(b)(1) of the Act, 21 U.S.C.section 360bbb-3(b)(1), unless the authorization is terminated  or revoked sooner.       Influenza A by PCR NEGATIVE NEGATIVE Final   Influenza B by PCR NEGATIVE NEGATIVE Final    Comment: (NOTE) The Xpert Xpress SARS-CoV-2/FLU/RSV plus assay is intended as an aid in the diagnosis of influenza from Nasopharyngeal swab specimens and should not be used as a sole basis for treatment. Nasal washings and aspirates are unacceptable for Xpert Xpress SARS-CoV-2/FLU/RSV testing.  Fact Sheet for Patients: BloggerCourse.com  Fact Sheet for Healthcare Providers: [employer]it  This test is not yet approved or cleared by the [place] FDA and has been authorized for detection and/or diagnosis of SARS-CoV-2 by FDA under an Emergency Use Authorization (EUA). This EUA will remain in effect (meaning this test can be used) for the duration of the COVID-19 declaration under Section 564(b)(1) of the Act, 21 U.S.C. section 360bbb-3(b)(1), unless the authorization is terminated or revoked.  Performed at [facility], [address], [place], [place] [address]      Radiological Exams on Admission: DG Chest 2 View  Result Date: 02/05/2021 CLINICAL DATA:  Shortness of breath EXAM: CHEST - 2 VIEW COMPARISON:  None. FINDINGS: Cardiomegaly. Diffuse bilateral airspace disease. No effusions. No acute bony abnormality. IMPRESSION: Cardiomegaly with diffuse bilateral airspace disease concerning for edema. Electronically Signed   By: Charlett Nose M.D.   On: 02/05/2021 02:53   CT Angio Chest PE W and/or Wo Contrast  Result Date: 02/05/2021 CLINICAL DATA:  Cough and shortness of breath for the past 4-5 days. Elevated D-dimer. EXAM: CT ANGIOGRAPHY CHEST WITH CONTRAST TECHNIQUE: Multidetector CT imaging of the chest was performed using the standard protocol during bolus administration of intravenous contrast. Multiplanar CT image reconstructions and MIPs were obtained to evaluate the vascular anatomy. CONTRAST:  OMNIPAQUE IOHEXOL 350 MG/ML SOLN COMPARISON:  Chest x-ray from same day. FINDINGS: Cardiovascular: Satisfactory opacification of the pulmonary arteries to the segmental level. No evidence of pulmonary embolism. Cardiomegaly. No pericardial effusion. No thoracic aortic aneurysm. Mediastinum/Nodes: No enlarged mediastinal, hilar, or axillary lymph nodes. Thyroid gland, trachea, and esophagus demonstrate no significant findings. Lungs/Pleura: Scattered smooth interlobular septal thickening. Patchy irregular ground-glass densities in both upper lobes, and to a lesser extent in the right lower lobe. Small right and trace left pleural effusions. Small amount of fluid along the left major fissure. No pneumothorax. Upper Abdomen: No acute abnormality. Musculoskeletal: No chest wall abnormality. No acute or significant osseous findings. Review of the MIP images confirms the above findings. IMPRESSION: 1. No evidence of pulmonary embolism. 2. Congestive heart failure. Mild interstitial and alveolar  pulmonary edema with small right and trace left pleural effusions. Electronically Signed   By: Obie Dredge M.D.   On: 02/05/2021 05:20    EKG: Independently reviewed. Sinus tachycardia, rate 127.   Assessment/Plan   1. New-onset CHF  - Presents with 4-5 days non-productive cough, SOB, and palpitations and found to have cardiomegaly and pulm edema on imaging, elevated BNP - Treated with 40 mg IV Lasix in ED  - Check TSH and echocardiogram, continue cardiac monitoring, continue diuresis with IV Lasix 40 mg IV q12h, monitor electrolytes and renal function, consult cardiology     DVT prophylaxis: Lovenox  Code Status: Full  Level of Care: Level of care: Telemetry Family Communication: none present  Disposition Plan:  Patient is from: Home  Anticipated d/c is to: home  Anticipated d/c date is: 02/08/21  Patient currently: Pending echocardiogram, additional bloodwork, likely cardiology consultation  Consults called: none Admission status: Inpatient     Briscoe Deutscher, MD [facility]  02/05/2021, 6:15 AM

## 2021-02-05 NOTE — Consult Note (Addendum)
Referring Physician: Dr. Lorin GlassBinaya Dahal, MD  Anna [name] is an 25 y.o. female.                       Chief Complaint: CHF  HPI: 25 years old black female with no known cardiac history has 4-5 days h/o of shortness of breath, non-productive cough and palpitation. She had exposure 3 weeks ago to step dad who had COVID. Her current PCR test is negative for COVID and influenza A and B. She denies FH of dilated cardiomyopathy. She denies being pregnant. She claims to have lost 11 pounds of weight in 3 weeks from lack of appetite. She denies cold intolerance. Her BNP is elevated to 506.5 pg/mL. Troponin I is normal x 2. TSH is normal. EKG shows sinus tachycardia with poor r wave progression. Chest XR is positive for cardiomegaly and pulmonary edema.  CT angio chest for elevated D-dimer is negative for PE and positive for pulmonary edema.  Echocardiogram shows dilated LV with severe hypokinesia and severe MR with LVEF 20 %-25 %.   Past Medical History:  Diagnosis Date   Medical history non-contributory       History reviewed. No pertinent surgical history.  Family History  Problem Relation Age of Onset   Cancer Maternal Great-grandmother    Social History:  reports that she has never smoked. She has never used smokeless tobacco. She reports current alcohol use. She reports that she does not use drugs.  Allergies: No Known Allergies  Medications Prior to Admission  Medication Sig Dispense Refill   Acetaminophen-DM (VICKS DAYQUIL HBP COLD & FLU) 325-10 MG CAPS Take 2 capsules by mouth every 6 (six) hours as needed (cold symptoms).      Results for orders placed or performed during the hospital encounter of 02/05/21 (from the past 48 hour(s))  CBC with Differential/Platelet     Status: Abnormal   Collection Time: 02/05/21  2:19 AM  Result Value Ref Range   WBC 7.4 4.0 - 10.5 K/uL   RBC 3.93 3.87 - 5.11 MIL/uL   Hemoglobin 11.1 (L) 12.0 - 15.0 g/dL   HCT 16.133.4 (L) 09.636.0 - 04.546.0 %   MCV  85.0 80.0 - 100.0 fL   MCH 28.2 26.0 - 34.0 pg   MCHC 33.2 30.0 - 36.0 g/dL   RDW 40.917.9 (H) 81.111.5 - 91.415.5 %   Platelets 244 150 - 400 K/uL    Comment: CONSISTENT WITH PREVIOUS RESULT REPEATED TO VERIFY    nRBC 0.0 0.0 - 0.2 %   Neutrophils Relative % 51 %   Neutro Abs 3.8 1.7 - 7.7 K/uL   Lymphocytes Relative 37 %   Lymphs Abs 2.7 0.7 - 4.0 K/uL   Monocytes Relative 10 %   Monocytes Absolute 0.7 0.1 - 1.0 K/uL   Eosinophils Relative 1 %   Eosinophils Absolute 0.1 0.0 - 0.5 K/uL   Basophils Relative 1 %   Basophils Absolute 0.0 0.0 - 0.1 K/uL   Immature Granulocytes 0 %   Abs Immature Granulocytes 0.02 0.00 - 0.07 K/uL    Comment: Performed at [facility], [address] [place], [place] [id number]  Basic metabolic panel     Status: Abnormal   Collection Time: 02/05/21  2:19 AM  Result Value Ref Range   Sodium 137 135 - 145 mmol/L   Potassium 3.7 3.5 - 5.1 mmol/L   Chloride 109 98 - 111 mmol/L   CO2 21 (L) 22 - 32 mmol/L  Glucose, Bld 113 (H) 70 - 99 mg/dL    Comment: Glucose reference range applies only to samples taken after fasting for at least 8 hours.   BUN 10 6 - 20 mg/dL   Creatinine, Ser 5.18 (H) 0.44 - 1.00 mg/dL   Calcium 8.8 (L) 8.9 - 10.3 mg/dL   GFR, Estimated >84 >16 mL/min    Comment: (NOTE) Calculated using the CKD-EPI Creatinine Equation (2021)    Anion gap 7 5 - 15    Comment: Performed at [facility], [address], [place], [place] [address]  Resp Panel by RT-PCR (Flu A&B, Covid)     Status: None   Collection Time: 02/05/21  2:53 AM  Result Value Ref Range   SARS Coronavirus 2 by RT PCR NEGATIVE NEGATIVE    Comment: (NOTE) SARS-CoV-2 target nucleic acids are NOT DETECTED.  The SARS-CoV-2 RNA is generally detectable in upper respiratory specimens during the acute phase of infection. The lowest concentration of SARS-CoV-2 viral copies this assay can detect is 138 copies/mL. A negative result does not preclude  SARS-Cov-2 infection and should not be used as the sole basis for treatment or other patient management decisions. A negative result may occur with  improper specimen collection/handling, submission of specimen other than nasopharyngeal swab, presence of viral mutation(s) within the areas targeted by this assay, and inadequate number of viral copies(<138 copies/mL). A negative result must be combined with clinical observations, patient history, and epidemiological information. The expected result is Negative.  Fact Sheet for Patients:  BloggerCourse.com  Fact Sheet for Healthcare Providers:  SeriousBroker.it  This test is no t yet approved or cleared by the [place] FDA and  has been authorized for detection and/or diagnosis of SARS-CoV-2 by FDA under an Emergency Use Authorization (EUA). This EUA will remain  in effect (meaning this test can be used) for the duration of the COVID-19 declaration under Section 564(b)(1) of the Act, 21 U.S.C.section 360bbb-3(b)(1), unless the authorization is terminated  or revoked sooner.       Influenza A by PCR NEGATIVE NEGATIVE   Influenza B by PCR NEGATIVE NEGATIVE    Comment: (NOTE) The Xpert Xpress SARS-CoV-2/FLU/RSV plus assay is intended as an aid in the diagnosis of influenza from Nasopharyngeal swab specimens and should not be used as a sole basis for treatment. Nasal washings and aspirates are unacceptable for Xpert Xpress SARS-CoV-2/FLU/RSV testing.  Fact Sheet for Patients: BloggerCourse.com  Fact Sheet for Healthcare Providers: SeriousBroker.it  This test is not yet approved or cleared by the [place] FDA and has been authorized for detection and/or diagnosis of SARS-CoV-2 by FDA under an Emergency Use Authorization (EUA). This EUA will remain in effect (meaning this test can be used) for the duration of the COVID-19  declaration under Section 564(b)(1) of the Act, 21 U.S.C. section 360bbb-3(b)(1), unless the authorization is terminated or revoked.  Performed at [facility], [address], [place], [place] [address]   Blood gas, arterial (at [facility])     Status: Abnormal   Collection Time: 02/05/21  2:54 AM  Result Value Ref Range   FIO2 21.00    pH, Arterial 7.466 (H) 7.350 - 7.450   pCO2 arterial 30.5 (L) 32.0 - 48.0 mmHg   pO2, Arterial 60.1 (L) 83.0 - 108.0 mmHg   Bicarbonate 21.8 20.0 - 28.0 mmol/L   Acid-base deficit 0.9 0.0 - 2.0 mmol/L   O2 Saturation 91.4 %   Patient temperature 98.2  Allens test (pass/fail) PASS PASS    Comment: Performed at [facility], [address], [place], [place] [address]  Brain natriuretic peptide     Status: Abnormal   Collection Time: 02/05/21  3:14 AM  Result Value Ref Range   B Natriuretic Peptide 506.5 (H) 0.0 - 100.0 pg/mL    Comment: Performed at [facility], [address], [place], [place] [address]  Troponin I (High Sensitivity)     Status: None   Collection Time: 02/05/21  3:14 AM  Result Value Ref Range   Troponin I (High Sensitivity) 15 <18 ng/L    Comment: (NOTE) Elevated high sensitivity troponin I (hsTnI) values and significant  changes across serial measurements may suggest ACS but many other  chronic and acute conditions are known to elevate hsTnI results.  Refer to the "Links" section for chest pain algorithms and additional  guidance. Performed at [facility], [address] [place], [place] [address]   D-dimer, quantitative     Status: Abnormal   Collection Time: 02/05/21  3:14 AM  Result Value Ref Range   D-Dimer, Quant 2.48 (H) 0.00 - 0.50 ug/mL-FEU    Comment: (NOTE) At the manufacturer cut-off value of 0.5 g/mL FEU, this assay has a negative predictive value of 95-100%.This assay is intended for use in conjunction with a clinical pretest  probability (PTP) assessment model to exclude pulmonary embolism (PE) and deep venous thrombosis (DVT) in outpatients suspected of PE or DVT. Results should be correlated with clinical presentation. Performed at [facility], [address], [place], [place] [address]   hCG, quantitative, pregnancy     Status: None   Collection Time: 02/05/21  3:14 AM  Result Value Ref Range   hCG, Beta Chain, Quant, S <1 <5 mIU/mL    Comment:          GEST. AGE      CONC.  (mIU/mL)   <=1 WEEK        5 - 50     2 WEEKS       50 - 500     3 WEEKS       100 - 10,000     4 WEEKS     1,000 - 30,000     5 WEEKS     3,500 - 115,000   6-8 WEEKS     12,000 - 270,000    12 WEEKS     15,000 - 220,000        FEMALE AND NON-PREGNANT FEMALE:     LESS THAN 5 mIU/mL Performed at [facility], [address], [place], [place] [address]   Magnesium     Status: None   Collection Time: 02/05/21  3:14 AM  Result Value Ref Range   Magnesium 1.8 1.7 - 2.4 mg/dL    Comment: Performed at [facility], [address], [place], [place] [address]  Hepatic function panel     Status: None   Collection Time: 02/05/21  3:14 AM  Result Value Ref Range   Total Protein 7.1 6.5 - 8.1 g/dL   Albumin 3.6 3.5 - 5.0 g/dL   AST 21 15 - 41 U/L   ALT 22 0 - 44 U/L   Alkaline Phosphatase 64 38 - 126 U/L   Total Bilirubin 0.5 0.3 - 1.2 mg/dL   Bilirubin, Direct 0.1 0.0 - 0.2 mg/dL   Indirect Bilirubin 0.4 0.3 - 0.9 mg/dL  Comment: Performed at [facility], [address], [place], [place] [address]  Troponin I (High Sensitivity)     Status: None   Collection Time: 02/05/21  6:21 AM  Result Value Ref Range   Troponin I (High Sensitivity) 14 <18 ng/L    Comment: (NOTE) Elevated high sensitivity troponin I (hsTnI) values and significant  changes across serial measurements may suggest ACS but many other  chronic and acute conditions are known to elevate  hsTnI results.  Refer to the "Links" section for chest pain algorithms and additional  guidance. Performed at [facility], [address], [place], [place] [address]   TSH     Status: None   Collection Time: 02/05/21  6:21 AM  Result Value Ref Range   TSH 1.950 0.350 - 4.500 uIU/mL    Comment: Performed by a 3rd Generation assay with a functional sensitivity of <=0.01 uIU/mL. Performed at [facility], [address], [place], [place] [address]   Urine rapid drug screen (hosp performed)     Status: Abnormal   Collection Time: 02/05/21  7:30 AM  Result Value Ref Range   Opiates NONE DETECTED NONE DETECTED   Cocaine NONE DETECTED NONE DETECTED   Benzodiazepines NONE DETECTED NONE DETECTED   Amphetamines NONE DETECTED NONE DETECTED   Tetrahydrocannabinol POSITIVE (A) NONE DETECTED   Barbiturates NONE DETECTED NONE DETECTED    Comment: (NOTE) DRUG SCREEN FOR MEDICAL PURPOSES ONLY.  IF CONFIRMATION IS NEEDED FOR ANY PURPOSE, NOTIFY LAB WITHIN 5 DAYS.  LOWEST DETECTABLE LIMITS FOR URINE DRUG SCREEN Drug Class                     Cutoff (ng/mL) Amphetamine and metabolites    1000 Barbiturate and metabolites    200 Benzodiazepine                 200 Tricyclics and metabolites     300 Opiates and metabolites        300 Cocaine and metabolites        300 THC                            50 Performed at [facility], [address] [place], [place] [address]   Urinalysis, Routine w reflex microscopic Urine, Clean Catch     Status: Abnormal   Collection Time: 02/05/21  7:30 AM  Result Value Ref Range   Color, Urine YELLOW YELLOW   APPearance CLEAR CLEAR   Specific Gravity, Urine <1.005 (L) 1.005 - 1.030   pH 6.0 5.0 - 8.0   Glucose, UA NEGATIVE NEGATIVE mg/dL   Hgb urine dipstick NEGATIVE NEGATIVE   Bilirubin Urine NEGATIVE NEGATIVE   Ketones, ur NEGATIVE NEGATIVE mg/dL   Protein, ur NEGATIVE NEGATIVE mg/dL   Nitrite  NEGATIVE NEGATIVE   Leukocytes,Ua NEGATIVE NEGATIVE    Comment: Microscopic not done on urines with negative protein, blood, leukocytes, nitrite, or glucose < 500 mg/dL. Performed at [facility], [address], [place], [place] [address]    DG Chest 2 View  Result Date: 02/05/2021 CLINICAL DATA:  Shortness of breath EXAM: CHEST - 2 VIEW COMPARISON:  None. FINDINGS: Cardiomegaly. Diffuse bilateral airspace disease. No effusions. No acute bony abnormality. IMPRESSION: Cardiomegaly with diffuse bilateral airspace disease concerning for edema. Electronically Signed   By: Charlett Nose M.D.   On: 02/05/2021 02:53   CT Angio  Chest PE W and/or Wo Contrast  Result Date: 02/05/2021 CLINICAL DATA:  Cough and shortness of breath for the past 4-5 days. Elevated D-dimer. EXAM: CT ANGIOGRAPHY CHEST WITH CONTRAST TECHNIQUE: Multidetector CT imaging of the chest was performed using the standard protocol during bolus administration of intravenous contrast. Multiplanar CT image reconstructions and MIPs were obtained to evaluate the vascular anatomy. CONTRAST:  OMNIPAQUE IOHEXOL 350 MG/ML SOLN COMPARISON:  Chest x-ray from same day. FINDINGS: Cardiovascular: Satisfactory opacification of the pulmonary arteries to the segmental level. No evidence of pulmonary embolism. Cardiomegaly. No pericardial effusion. No thoracic aortic aneurysm. Mediastinum/Nodes: No enlarged mediastinal, hilar, or axillary lymph nodes. Thyroid gland, trachea, and esophagus demonstrate no significant findings. Lungs/Pleura: Scattered smooth interlobular septal thickening. Patchy irregular ground-glass densities in both upper lobes, and to a lesser extent in the right lower lobe. Small right and trace left pleural effusions. Small amount of fluid along the left major fissure. No pneumothorax. Upper Abdomen: No acute abnormality. Musculoskeletal: No chest wall abnormality. No acute or significant osseous findings. Review of the  MIP images confirms the above findings. IMPRESSION: 1. No evidence of pulmonary embolism. 2. Congestive heart failure. Mild interstitial and alveolar pulmonary edema with small right and trace left pleural effusions. Electronically Signed   By: Obie Dredge M.D.   On: 02/05/2021 05:20   ECHOCARDIOGRAM COMPLETE  Result Date: 02/05/2021    ECHOCARDIOGRAM REPORT   Patient Name:   Anna [name] Date of Exam: 02/05/2021 Medical Rec #:  [id number]     Height:       59.0 in Accession #:    [id number]    Weight:       196.0 lb Date of Birth:  08/21/1995      BSA:          1.829 m Patient Age:    25 years      BP:           113/67 mmHg Patient Gender: F             HR:           121 bpm. Exam Location:  Inpatient Procedure: 2D Echo, 3D Echo, Cardiac Doppler, Color Doppler and Intracardiac            Opacification Agent Indications:    R06.02 SOB; I51.7 Cardiomegaly  History:        Patient has no prior history of Echocardiogram examinations.                 Cardiomegaly, Abnormal ECG; Signs/Symptoms:Dyspnea and Shortness                 of Breath.  Sonographer:    Sheralyn Boatman RDCS Referring Phys: [id number] TIMOTHY S OPYD IMPRESSIONS  1. 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 . Left ventricular ejection fraction, by estimation, is <20%. Left ventricular ejection fraction by 3D volume is 19 %. The left ventricle has severely decreased function. The left ventricle demonstrates global hypokinesis. The left ventricular internal cavity size was moderately to severely dilated. Left ventricular diastolic parameters are indeterminate.  2. Right ventricular systolic function is normal. The right ventricular size is normal.  3. The mitral valve is grossly normal. Moderate to severe mitral valve regurgitation.  4. The aortic valve is normal in structure. Aortic valve regurgitation is not visualized. No aortic stenosis is present. FINDINGS  Left Ventricle: Definity contrast reveals  linear filling defects close to the  apex - likely LV mural thrombi. Left ventricular ejection fraction, by estimation, is <20%. Left ventricular ejection fraction by 3D volume is 19 %. The left ventricle has severely decreased function. The left ventricle demonstrates global hypokinesis. Definity contrast agent was given IV to delineate the left ventricular endocardial borders. The left ventricular internal cavity size was moderately to severely dilated. There is no left ventricular hypertrophy. Left ventricular diastolic parameters are indeterminate. Right Ventricle: The right ventricular size is normal. Right vetricular wall thickness was not well visualized. Right ventricular systolic function is normal. Left Atrium: Left atrial size was normal in size. Right Atrium: Right atrial size was normal in size. Pericardium: There is no evidence of pericardial effusion. Mitral Valve: The mitral valve is grossly normal. Moderate to severe mitral valve regurgitation. Tricuspid Valve: The tricuspid valve is grossly normal. Tricuspid valve regurgitation is mild. Aortic Valve: The aortic valve is normal in structure. Aortic valve regurgitation is not visualized. No aortic stenosis is present. Pulmonic Valve: The pulmonic valve was normal in structure. Pulmonic valve regurgitation is not visualized. No evidence of pulmonic stenosis. Aorta: The aortic root and ascending aorta are structurally normal, with no evidence of dilitation. IAS/Shunts: The atrial septum is grossly normal.  LEFT VENTRICLE PLAX 2D LVIDd:         6.40 cm         Diastology LVIDs:         5.70 cm         LV e' medial:    7.73 cm/s LV PW:         0.70 cm         LV E/e' medial:  14.6 LV IVS:        0.80 cm         LV e' lateral:   15.60 cm/s LVOT diam:     1.60 cm         LV E/e' lateral: 7.2 LV SV:         23 LV SV Index:   12 LVOT Area:     2.01 cm        3D Volume EF                                LV 3D EF:    Left                                              ventricul LV Volumes (MOD)                            ar LV vol d, MOD    187.0 ml                   ejection A2C:                                        fraction LV vol d, MOD    198.0 ml                   by 3D A4C:  volume is LV vol s, MOD    146.0 ml                   19 %. A2C: LV vol s, MOD    172.0 ml A4C:                           3D Volume EF: LV SV MOD A2C:   41.0 ml       3D EF:        19 % LV SV MOD A4C:   198.0 ml      LV EDV:       218 ml LV SV MOD BP:    29.0 ml       LV ESV:       178 ml                                LV SV:        40 ml RIGHT VENTRICLE             IVC RV S prime:     11.30 cm/s  IVC diam: 1.30 cm TAPSE (M-mode): 2.0 cm LEFT ATRIUM             Index       RIGHT ATRIUM          Index LA diam:        3.80 cm 2.08 cm/m  RA Area:     8.09 cm LA Vol (A2C):   58.8 ml 32.16 ml/m RA Volume:   14.30 ml 7.82 ml/m LA Vol (A4C):   48.0 ml 26.25 ml/m LA Biplane Vol: 56.9 ml 31.12 ml/m  AORTIC VALVE             PULMONIC VALVE LVOT Vmax:   83.10 cm/s  PR End Diast Vel: 2.38 msec LVOT Vmean:  55.200 cm/s LVOT VTI:    0.112 m  AORTA Ao Root diam: 2.20 cm Ao Asc diam:  2.40 cm MITRAL VALVE MV Area (PHT): 5.84 cm      SHUNTS MV Decel Time: 130 msec      Systemic VTI:  0.11 m MR Peak grad:    85.0 mmHg   Systemic Diam: 1.60 cm MR Mean grad:    52.0 mmHg MR Vmax:         461.00 cm/s MR Vmean:        330.0 cm/s MR PISA:         1.57 cm MR PISA Eff ROA: 13 mm MR PISA Radius:  0.50 cm MV E velocity: 113.00 cm/s Kristeen Miss MD Electronically signed by Kristeen Miss MD Signature Date/Time: 02/05/2021/12:39:21 PM    Final     Review Of Systems Constitutional: No fever, chills, positive weight loss over chronic weight gain. Eyes: No vision change. No discharge or pain. Ears: No hearing loss, No tinnitus. Respiratory: No asthma, COPD, pneumonias. Recent shortness of breath. No hemoptysis. Cardiovascular: No chest pain, recent palpitation, leg  edema. Gastrointestinal: No nausea, vomiting, diarrhea, constipation. No GI bleed. No hepatitis. Genitourinary: No dysuria, hematuria, kidney stone. No incontinance. Neurological: No headache, stroke, seizures.  Psychiatry: No psych facility admission for anxiety, depression, suicide. No detox. Skin: No rash. Musculoskeletal: No joint pain, fibromyalgia. No neck pain, back pain. Lymphadenopathy: No lymphadenopathy. Hematology: No anemia or easy bruising.   Blood pressure 119/84, pulse (!) 120, temperature  97.6 F (36.4 C), temperature source Oral, resp. rate 18, height 4\' 11"  (1.499 m), weight 88.9 kg, last menstrual period 01/13/2021, SpO2 99 %. Body mass index is 39.59 kg/m. General appearance: alert, cooperative, appears stated age and mild respiratory distress Head: Normocephalic, atraumatic. Eyes: Brown eyes, pink conjunctiva, corneas clear. PERRL, EOM's intact. Neck: No adenopathy, no carotid bruit, no JVD, supple, symmetrical, trachea midline and thyroid not enlarged. Resp: Basal crackles to auscultation bilaterally. Cardio: Regular rate and rhythm, S1, S2 normal, II/VI systolic murmur, no click, rub + S3 gallop GI: Soft, non-tender; bowel sounds normal; no organomegaly. Extremities: No edema, cyanosis or clubbing. Skin: Warm and dry.  Neurologic: Alert and oriented X 3, normal strength.  Assessment/Plan Acute systolic left heart failure, HFrEF Dilated cardiomyopathy, Idiopathic +/- alcoholic R/O ischemia COVID -19 exposure Obesity  Plan: Agree with Lasix use. Add small dose COREG and ENTRESTO. If stable add spironolactone tomorrow. May need right and left heart catheterization in near future.  Time spent: Review of old records, Lab, x-rays, EKG, other cardiac tests, examination, discussion with patient/Physician over 70 minutes.  01/15/2021, MD  02/05/2021, 2:14 PM

## 2021-02-05 NOTE — Progress Notes (Signed)
  Echocardiogram 2D Echocardiogram has been performed.  Anna [name] 02/05/2021, 12:28 PM

## 2021-02-05 NOTE — Progress Notes (Signed)
ANTICOAGULATION CONSULT NOTE - Follow Up Consult  Pharmacy Consult for IV Heparin Indication:  LV thrombus  No Known Allergies  Patient Measurements: Height: 4\' 11"  (149.9 cm) Weight: 88.9 kg (196 lb) IBW/kg (Calculated) : 43.2 Heparin Dosing Weight: 64.5 kg  Vital Signs: Temp: 98.7 F (37.1 C) (09/06 1727) Temp Source: Oral (09/06 1727) BP: 108/77 (09/06 1758) Pulse Rate: 122 (09/06 1758)  Labs: Recent Labs    02/05/21 0219 02/05/21 0314 02/05/21 0621 02/05/21 1949  HGB 11.1*  --   --   --   HCT 33.4*  --   --   --   PLT 244  --   --   --   HEPARINUNFRC  --   --   --  0.79*  CREATININE 1.12*  --   --   --   TROPONINIHS  --  15 14  --      Estimated Creatinine Clearance: 74.5 mL/min (A) (by C-G formula based on SCr of 1.12 mg/dL (H)).   Medical History: Past Medical History:  Diagnosis Date   Medical history non-contributory     Medications:  Scheduled:   carvedilol  3.125 mg Oral BID WC   furosemide  40 mg Intravenous Q12H   sacubitril-valsartan  1 tablet Oral BID   sodium chloride flush  3 mL Intravenous Q12H   Infusions:   sodium chloride     heparin 1,350 Units/hr (02/05/21 1450)   PRN: sodium chloride, acetaminophen, ondansetron (ZOFRAN) IV, sodium chloride flush  Assessment: 25 yo female presented to ED with cough and shortness of breath found to have new onset HFrEF and now LV thrombus to start IV heparin. Baseline labs obtained  02/05/2021  First heparin level slightly supratherapeutic 0.79 on heparin 1350 units/hr No bleeding or complications with infusion documented  Goal of Therapy:  Heparin level 0.3-0.7 units/ml Monitor platelets by anticoagulation protocol: Yes   Plan:  Decrease IV heparin to 1250 units/hr Check heparin level 6 hours after heparin reduced Daily CBC  04/07/2021, PharmD, BCPS Pharmacy: [phone] 02/05/2021,9:07 PM

## 2021-02-06 LAB — BASIC METABOLIC PANEL
Anion gap: 12 (ref 5–15)
BUN: 15 mg/dL (ref 6–20)
CO2: 22 mmol/L (ref 22–32)
Calcium: 9.1 mg/dL (ref 8.9–10.3)
Chloride: 101 mmol/L (ref 98–111)
Creatinine, Ser: 1.08 mg/dL — ABNORMAL HIGH (ref 0.44–1.00)
GFR, Estimated: >60 mL/min (ref >60)
Glucose, Bld: 91 mg/dL (ref 70–99)
Potassium: 3.8 mmol/L (ref 3.5–5.1)
Sodium: 135 mmol/L (ref 135–145)

## 2021-02-06 LAB — HEPATIC FUNCTION PANEL
ALT: 20 U/L (ref 0–44)
AST: 18 U/L (ref 15–41)
Albumin: 3.9 g/dL (ref 3.5–5.0)
Alkaline Phosphatase: 53 U/L (ref 38–126)
Bilirubin, Direct: 0.2 mg/dL (ref 0.0–0.2)
Indirect Bilirubin: 0.8 mg/dL (ref 0.3–0.9)
Total Bilirubin: 1 mg/dL (ref 0.3–1.2)
Total Protein: 7.4 g/dL (ref 6.5–8.1)

## 2021-02-06 LAB — CBC
HCT: 36.3 % (ref 36.0–46.0)
Hemoglobin: 12 g/dL (ref 12.0–15.0)
MCH: 27.6 pg (ref 26.0–34.0)
MCHC: 33.1 g/dL (ref 30.0–36.0)
MCV: 83.6 fL (ref 80.0–100.0)
Platelets: 232 10*3/uL (ref 150–400)
RBC: 4.34 MIL/uL (ref 3.87–5.11)
RDW: 17.7 % — ABNORMAL HIGH (ref 11.5–15.5)
WBC: 6.8 10*3/uL (ref 4.0–10.5)
nRBC: 0 % (ref 0.0–0.2)

## 2021-02-06 LAB — HEPARIN LEVEL (UNFRACTIONATED)
Heparin Unfractionated: 1.03 IU/mL — ABNORMAL HIGH (ref 0.30–0.70)
Heparin Unfractionated: 0.61 IU/mL (ref 0.30–0.70)
Heparin Unfractionated: 0.39 IU/mL (ref 0.30–0.70)

## 2021-02-06 LAB — MAGNESIUM: Magnesium: 2.3 mg/dL (ref 1.7–2.4)

## 2021-02-06 MED ORDER — HEPARIN (PORCINE) 25000 UT/250ML-% IV SOLN
1050.0000 [IU]/h | INTRAVENOUS | Status: DC
  Administered 2021-02-06: 1050 [IU]/h via INTRAVENOUS
  Filled 2021-02-06 (×2): qty 250

## 2021-02-06 MED ORDER — SPIRONOLACTONE 12.5 MG HALF TABLET
12.5000 mg | ORAL_TABLET | Freq: Two times a day (BID) | ORAL | Status: DC
  Administered 2021-02-06 – 2021-02-07 (×3): 12.5 mg via ORAL
  Filled 2021-02-06 (×3): qty 1

## 2021-02-06 NOTE — H&P (View-Only) (Signed)
Ref: Patient, No Pcp Per (Inactive)   Subjective:  Awake. Feels little better. Shortness of breath continues with activity. Patient claims to have good urine output.  Myocarditis unlikely with normal Troponin levels. Patient admits to weekend alcohol intake and marijuana use. She also used to drink "lot" of water in a day. She is aware to follow 1200 cc fluid restriction, salt restriction and medication compliance. Discussed R + L heart cath tomorrow, procedure, risks and alternatives. Creatinine is 1.08.  Objective:  Vital Signs in the last 24 hours: Temp:  [97.6 F (36.4 C)-98.8 F (37.1 C)] 98.2 F (36.8 C) (09/07 1001) Pulse Rate:  [81-122] 105 (09/07 1001) Cardiac Rhythm: Normal sinus rhythm (09/07 0800) Resp:  [16-26] 18 (09/07 1001) BP: (88-119)/(53-84) 96/53 (09/07 1001) SpO2:  [96 %-100 %] 96 % (09/07 1001)  Physical Exam: BP Readings from Last 1 Encounters:  02/06/21 (!) 96/53     Wt Readings from Last 1 Encounters:  02/05/21 88.9 kg    Weight change:  Body mass index is 39.59 kg/m. HEENT: [place]/AT, Eyes-Brown, Conjunctiva-Pink, Sclera-Non-icteric Neck: No JVD, No bruit, Trachea midline. Lungs:  Clearing, Bilateral. Cardiac:  Regular rhythm, normal S1 and S2, no S3. II/VI systolic murmur. Abdomen:  Soft, non-tender. BS present. Extremities:  No edema present. No cyanosis. No clubbing. CNS: AxOx3, Cranial nerves grossly intact, moves all 4 extremities.  Skin: Warm and dry.   Intake/Output from previous day: 09/06 0701 - 09/07 0700 In: 59.8 [I.V.:9.8; IV Piggyback:50] Out: 300 [Urine:300]    Lab Results: BMET    Component Value Date/Time   NA 135 02/06/2021 0403   NA 137 02/05/2021 0219   NA 138 03/03/2020 0010   K 3.8 02/06/2021 0403   K 3.7 02/05/2021 0219   K 3.9 03/03/2020 0010   CL 101 02/06/2021 0403   CL 109 02/05/2021 0219   CL 107 03/03/2020 0010   CO2 22 02/06/2021 0403   CO2 21 (L) 02/05/2021 0219   CO2 19 (L) 03/03/2020 0010   GLUCOSE  91 02/06/2021 0403   GLUCOSE 113 (H) 02/05/2021 0219   GLUCOSE 115 (H) 03/03/2020 0010   BUN 15 02/06/2021 0403   BUN 10 02/05/2021 0219   BUN 13 03/03/2020 0010   CREATININE 1.08 (H) 02/06/2021 0403   CREATININE 1.12 (H) 02/05/2021 0219   CREATININE 1.00 03/03/2020 0010   CALCIUM 9.1 02/06/2021 0403   CALCIUM 8.8 (L) 02/05/2021 0219   CALCIUM 8.3 (L) 03/03/2020 0010   GFRNONAA >60 02/06/2021 0403   GFRNONAA >60 02/05/2021 0219   GFRNONAA >60 03/03/2020 0010   GFRAA >60 03/03/2020 0010   CBC    Component Value Date/Time   WBC 6.8 02/06/2021 0403   RBC 4.34 02/06/2021 0403   HGB 12.0 02/06/2021 0403   HCT 36.3 02/06/2021 0403   PLT 232 02/06/2021 0403   MCV 83.6 02/06/2021 0403   MCH 27.6 02/06/2021 0403   MCHC 33.1 02/06/2021 0403   RDW 17.7 (H) 02/06/2021 0403   LYMPHSABS 2.7 02/05/2021 0219   MONOABS 0.7 02/05/2021 0219   EOSABS 0.1 02/05/2021 0219   BASOSABS 0.0 02/05/2021 0219   HEPATIC Function Panel Recent Labs    03/03/20 0010 02/05/21 0314 02/06/21 0403  PROT 7.5 7.1 7.4   HEMOGLOBIN A1C No components found for: HGA1C,  MPG CARDIAC ENZYMES No results found for: CKTOTAL, CKMB, CKMBINDEX, TROPONINI BNP No results for input(s): PROBNP in the last 8760 hours. TSH Recent Labs    02/05/21 0621  TSH 1.950  CHOLESTEROL No results for input(s): CHOL in the last 8760 hours.  Scheduled Meds:  carvedilol  3.125 mg Oral BID WC   furosemide  40 mg Intravenous Q12H   sacubitril-valsartan  1 tablet Oral BID   sodium chloride flush  3 mL Intravenous Q12H   spironolactone  12.5 mg Oral BID   Continuous Infusions:  sodium chloride     heparin 1,050 Units/hr (02/06/21 0641)   PRN Meds:.sodium chloride, acetaminophen, ondansetron (ZOFRAN) IV, sodium chloride flush  Assessment/Plan:  Acute systolic left heart failure, HFrEF Dilated cardiomyopathy Obesity COVID-19 exposure  Plan: Add spironolactone. R+ L heart cath tomorrow. Discussed low fat, low salt  diet and fluid restriction.   LOS: 1 day   Time spent including chart review, lab review, examination, discussion with patient/Nurse/Physician : 30 min   [name]  MD  02/06/2021, 10:11 AM

## 2021-02-06 NOTE — Progress Notes (Signed)
ANTICOAGULATION CONSULT NOTE - Follow Up Consult  Pharmacy Consult for IV Heparin Indication:  LV thrombus  No Known Allergies  Patient Measurements: Height: 4\' 11"  (149.9 cm) Weight: 88.9 kg (196 lb) IBW/kg (Calculated) : 43.2 Heparin Dosing Weight: Anna.5 kg  Vital Signs: Temp: 98.8 F (37.1 C) (09/06 2153) Temp Source: Oral (09/06 2153) BP: 88/61 (09/06 2153) Pulse Rate: 101 (09/06 2153)  Labs: Recent Labs    02/05/21 0219 02/05/21 0314 02/05/21 0621 02/05/21 1949 02/06/21 0403  HGB 11.1*  --   --   --   --   HCT 33.4*  --   --   --   --   PLT 244  --   --   --   --   HEPARINUNFRC  --   --   --  0.79* 1.03*  CREATININE 1.12*  --   --   --   --   TROPONINIHS  --  15 14  --   --      Estimated Creatinine Clearance: 74.5 mL/min (A) (by C-G formula based on SCr of 1.12 mg/dL (H)).   Medical History: Past Medical History:  Diagnosis Date   Medical history non-contributory     Medications:  Scheduled:   carvedilol  3.125 mg Oral BID WC   furosemide  40 mg Intravenous Q12H   sacubitril-valsartan  1 tablet Oral BID   sodium chloride flush  3 mL Intravenous Q12H   Infusions:   sodium chloride     heparin 1,250 Units/hr (02/05/21 2225)   PRN: sodium chloride, acetaminophen, ondansetron (ZOFRAN) IV, sodium chloride flush  Assessment: 25 yo [name] presented to ED with cough and shortness of breath found to have new onset HFrEF and now LV thrombus to start IV heparin. Baseline labs obtained  02/06/2021  HL 1.03 supra-therapeutic on 1250 units/hr No bleeding per RN  Goal of Therapy:  Heparin level 0.3-0.7 units/ml Monitor platelets by anticoagulation protocol: Yes   Plan:  Hold heparin x 1 hour Decrease IV heparin to 1050 units/hr Check heparin level 6 hours after heparin reduced Daily CBC  04/08/2021 RPh 02/06/2021, 4:37 AM

## 2021-02-06 NOTE — Progress Notes (Signed)
ANTICOAGULATION CONSULT NOTE - Follow Up Consult  Pharmacy Consult for IV Heparin Indication:  LV thrombus  No Known Allergies  Patient Measurements: Height: 4\' 11"  (149.9 cm) Weight: 88.9 kg (196 lb) IBW/kg (Calculated) : 43.2 Heparin Dosing Weight: 64.5 kg  Vital Signs: Temp: 98.2 F (36.8 C) (09/07 1001) Temp Source: Oral (09/07 1001) BP: 96/53 (09/07 1001) Pulse Rate: 105 (09/07 1001)  Labs: Recent Labs    02/05/21 0219 02/05/21 0314 02/05/21 0621 02/05/21 1949 02/06/21 0403 02/06/21 1207  HGB 11.1*  --   --   --  12.0  --   HCT 33.4*  --   --   --  36.3  --   PLT 244  --   --   --  232  --   HEPARINUNFRC  --   --   --  0.79* 1.03* 0.61  CREATININE 1.12*  --   --   --  1.08*  --   TROPONINIHS  --  15 14  --   --   --      Estimated Creatinine Clearance: 77.3 mL/min (A) (by C-G formula based on SCr of 1.08 mg/dL (H)).   Medical History: Past Medical History:  Diagnosis Date   Medical history non-contributory     Medications:  Scheduled:   carvedilol  3.125 mg Oral BID WC   furosemide  40 mg Intravenous Q12H   sacubitril-valsartan  1 tablet Oral BID   sodium chloride flush  3 mL Intravenous Q12H   spironolactone  12.5 mg Oral BID   Infusions:   sodium chloride     heparin 1,050 Units/hr (02/06/21 1241)   PRN: sodium chloride, acetaminophen, ondansetron (ZOFRAN) IV, sodium chloride flush  Assessment: 25 yo female presented to ED with cough and shortness of breath found to have new onset HFrEF and now LV thrombus to start IV heparin. Baseline labs obtained  02/06/2021  Heparin level now therapeutic on current IV heparin rate of 1050 units/hr CBC stable Per RN, no bleeding or issues to note  Goal of Therapy:  Heparin level 0.3-0.7 units/ml Monitor platelets by anticoagulation protocol: Yes   Plan:  Continue IV heparin at current rate of 1050 units/hr Recheck heparin level in 6 hours to confirm continued goal level at current IV heparin  rate Daily CBC and heparin level Noted plan for patient to have hearth cath tomorrow at [facility], PharmD, BCPS Secure Chat if ?s 02/06/2021 1:32 PM

## 2021-02-06 NOTE — Consult Note (Addendum)
Ref: Patient, No Pcp Per (Inactive)   Subjective:  Awake. Feels little better. Shortness of breath continues with activity. Patient claims to have good urine output.  Myocarditis unlikely with normal Troponin levels. Patient admits to weekend alcohol intake and marijuana use. She also used to drink "lot" of water in a day. She is aware to follow 1200 cc fluid restriction, salt restriction and medication compliance. Discussed R + L heart cath tomorrow, procedure, risks and alternatives. Creatinine is 1.08.  Objective:  Vital Signs in the last 24 hours: Temp:  [97.6 F (36.4 C)-98.8 F (37.1 C)] 98.2 F (36.8 C) (09/07 1001) Pulse Rate:  [81-122] 105 (09/07 1001) Cardiac Rhythm: Normal sinus rhythm (09/07 0800) Resp:  [16-26] 18 (09/07 1001) BP: (88-119)/(53-84) 96/53 (09/07 1001) SpO2:  [96 %-100 %] 96 % (09/07 1001)  Physical Exam: BP Readings from Last 1 Encounters:  02/06/21 (!) 96/53     Wt Readings from Last 1 Encounters:  02/05/21 88.9 kg    Weight change:  Body mass index is 39.59 kg/m. HEENT: [place]/AT, Eyes-Brown, Conjunctiva-Pink, Sclera-Non-icteric Neck: No JVD, No bruit, Trachea midline. Lungs:  Clearing, Bilateral. Cardiac:  Regular rhythm, normal S1 and S2, + S3. II/VI systolic murmur. Abdomen:  Soft, non-tender. BS present. Extremities:  No edema present. No cyanosis. No clubbing. CNS: AxOx3, Cranial nerves grossly intact, moves all 4 extremities.  Skin: Warm and dry.   Intake/Output from previous day: 09/06 0701 - 09/07 0700 In: 59.8 [I.V.:9.8; IV Piggyback:50] Out: 300 [Urine:300]    Lab Results: BMET    Component Value Date/Time   NA 135 02/06/2021 0403   NA 137 02/05/2021 0219   NA 138 03/03/2020 0010   K 3.8 02/06/2021 0403   K 3.7 02/05/2021 0219   K 3.9 03/03/2020 0010   CL 101 02/06/2021 0403   CL 109 02/05/2021 0219   CL 107 03/03/2020 0010   CO2 22 02/06/2021 0403   CO2 21 (L) 02/05/2021 0219   CO2 19 (L) 03/03/2020 0010   GLUCOSE  91 02/06/2021 0403   GLUCOSE 113 (H) 02/05/2021 0219   GLUCOSE 115 (H) 03/03/2020 0010   BUN 15 02/06/2021 0403   BUN 10 02/05/2021 0219   BUN 13 03/03/2020 0010   CREATININE 1.08 (H) 02/06/2021 0403   CREATININE 1.12 (H) 02/05/2021 0219   CREATININE 1.00 03/03/2020 0010   CALCIUM 9.1 02/06/2021 0403   CALCIUM 8.8 (L) 02/05/2021 0219   CALCIUM 8.3 (L) 03/03/2020 0010   GFRNONAA >60 02/06/2021 0403   GFRNONAA >60 02/05/2021 0219   GFRNONAA >60 03/03/2020 0010   GFRAA >60 03/03/2020 0010   CBC    Component Value Date/Time   WBC 6.8 02/06/2021 0403   RBC 4.34 02/06/2021 0403   HGB 12.0 02/06/2021 0403   HCT 36.3 02/06/2021 0403   PLT 232 02/06/2021 0403   MCV 83.6 02/06/2021 0403   MCH 27.6 02/06/2021 0403   MCHC 33.1 02/06/2021 0403   RDW 17.7 (H) 02/06/2021 0403   LYMPHSABS 2.7 02/05/2021 0219   MONOABS 0.7 02/05/2021 0219   EOSABS 0.1 02/05/2021 0219   BASOSABS 0.0 02/05/2021 0219   HEPATIC Function Panel Recent Labs    03/03/20 0010 02/05/21 0314 02/06/21 0403  PROT 7.5 7.1 7.4   HEMOGLOBIN A1C No components found for: HGA1C,  MPG CARDIAC ENZYMES No results found for: CKTOTAL, CKMB, CKMBINDEX, TROPONINI BNP No results for input(s): PROBNP in the last 8760 hours. TSH Recent Labs    02/05/21 0621  TSH 1.950  CHOLESTEROL No results for input(s): CHOL in the last 8760 hours.  Scheduled Meds:  carvedilol  3.125 mg Oral BID WC   furosemide  40 mg Intravenous Q12H   sacubitril-valsartan  1 tablet Oral BID   sodium chloride flush  3 mL Intravenous Q12H   spironolactone  12.5 mg Oral BID   Continuous Infusions:  sodium chloride     heparin 1,050 Units/hr (02/06/21 0641)   PRN Meds:.sodium chloride, acetaminophen, ondansetron (ZOFRAN) IV, sodium chloride flush  Assessment/Plan:  Acute systolic left heart failure, HFrEF Dilated cardiomyopathy Obesity COVID-19 exposure  Plan: Add spironolactone. R+ L heart cath tomorrow. Discussed low fat, low salt  diet and fluid restriction.   LOS: 1 day   Time spent including chart review, lab review, examination, discussion with patient/Nurse/Physician : 30 min   [name]  MD  02/06/2021, 10:11 AM

## 2021-02-06 NOTE — Progress Notes (Signed)
PROGRESS NOTE  Anna [name]  DOB: 1996-05-31  PCP: Patient, No Pcp Per (Inactive) [id number]  DOA: 02/05/2021  LOS: 1 day  Hospital Day: 2   Chief Complaint  Patient presents with   Shortness of Breath    Brief narrative: Anna [name] is a 25 y.o. female with no significant past medical history who presented to the ED last night with complaint of 4 to 5 days of nonproductive cough, shortness of breath, palpitation.  She had an episode of fever, chills, generalized aches, and sore throat on August 19 after exposure to her stepdad who had COVID though she was not tested.  She recovered from that illness before the current symptoms developed insidiously over the past few days.    In the ED, patient was afebrile, oxygen saturation low 90s, mildly tachypneic, tachycardic to 130s with blood pressure 130/67.   EKG features sinus tachycardia with rate 127.   Chest x-ray showed cardiomegaly and diffuse bilateral airspace opacities concerning for edema.  CTA chest was negative for PE but notable for mild interstitial and alveolar pulmonary edema and small right pleural effusion.   She was given IV Lasix, admitted to hospitalist service Echocardiogram showed EF less than 20% with LV mural thrombus. Cardiology consultation was obtained with Dr. Algie Coffer.  Subjective: Patient was seen and examined this morning.  Lying down in bed. Not in distress. Feels better. Remains dyspneic on exertion.   Assessment/Plan: New-onset CHF  -Presented with 4 to 5 days of progressive shortness of breath, nonproductive cough, palpitation.  Chest x-ray with pulm edema, BNP elevated. -Echocardiogram with EF less than 20%  -Currently on IV Lasix, Coreg and Entresto. -Cardiology consultation appreciated.  Noted a plan for cardiac cath tomorrow.  LV mural thrombus -Possibility of LV mural thrombus noted in echocardiogram. -Discussed with cardiology Dr. Algie Coffer.  Started on heparin drip.  Mobility: Encourage  ambulation Code Status:   Code Status: Full Code  Nutritional status: Body mass index is 39.59 kg/m.     Diet:  Diet Order             Diet Heart Room service appropriate? Yes; Fluid consistency: Thin; Fluid restriction: 1500 mL Fluid  Diet effective now                  DVT prophylaxis: Heparin drip   Antimicrobials: None Fluid: None Consultants: Cardiology Family Communication: None at bedside  Status is: Inpatient  Remains inpatient appropriate because: Needs IV Lasix, cardiac cath tomorrow.  Dispo: The patient is from: Home              Anticipated d/c is to: Home              Patient currently is not medically stable to d/c.   Difficult to place patient No     Infusions:   sodium chloride     heparin 1,050 Units/hr (02/06/21 0641)    Scheduled Meds:  carvedilol  3.125 mg Oral BID WC   furosemide  40 mg Intravenous Q12H   sacubitril-valsartan  1 tablet Oral BID   sodium chloride flush  3 mL Intravenous Q12H   spironolactone  12.5 mg Oral BID    Antimicrobials: Anti-infectives (From admission, onward)    None       PRN meds: sodium chloride, acetaminophen, ondansetron (ZOFRAN) IV, sodium chloride flush   Objective: Vitals:   02/06/21 0848 02/06/21 1001  BP: 101/60 (!) 96/53  Pulse: 81 (!) 105  Resp:  18  Temp:  98.2 F (36.8 C)  SpO2:  96%    Intake/Output Summary (Last 24 hours) at 02/06/2021 1218 Last data filed at 02/06/2021 0953 Gross per 24 hour  Intake 249.76 ml  Output 300 ml  Net -50.24 ml   Filed Weights   02/05/21 0219  Weight: 88.9 kg   Weight change:  Body mass index is 39.59 kg/m.   Physical Exam: General exam: Pleasant young African-American female.  Not in physical distress Skin: No rashes, lesions or ulcers. HEENT: Atraumatic, normocephalic, no obvious bleeding Lungs: Clear to auscultation bilaterally CVS: Regular rate and rhythm, no murmur GI/Abd soft, nontender, nondistended, bowel sound present CNS:  Alert, awake, oriented x3 Psychiatry: Mood appropriate Extremities: No pedal edema, no calf tenderness  Data Review: I have personally reviewed the laboratory data and studies available.  Recent Labs  Lab 02/05/21 0219 02/06/21 0403  WBC 7.4 6.8  NEUTROABS 3.8  --   HGB 11.1* 12.0  HCT 33.4* 36.3  MCV 85.0 83.6  PLT 244 232   Recent Labs  Lab 02/05/21 0219 02/05/21 0314 02/06/21 0403  NA 137  --  135  K 3.7  --  3.8  CL 109  --  101  CO2 21*  --  22  GLUCOSE 113*  --  91  BUN 10  --  15  CREATININE 1.12*  --  1.08*  CALCIUM 8.8*  --  9.1  MG  --  1.8 2.3    F/u labs ordered Unresulted Labs (From admission, onward)     Start     Ordered   02/06/21 1200  Heparin level (unfractionated)  Once-Timed,   TIMED        02/06/21 0439   02/06/21 0500  Basic metabolic panel  Daily,   R      02/05/21 0614   02/06/21 0500  CBC  Daily,   R      02/05/21 1324            Signed, Lorin Glass, MD [facility] 02/06/2021

## 2021-02-06 NOTE — Progress Notes (Signed)
ANTICOAGULATION CONSULT NOTE - Follow Up Consult  Pharmacy Consult for IV Heparin Indication:  LV thrombus  No Known Allergies  Patient Measurements: Height: 4\' 11"  (149.9 cm) Weight: 88.9 kg (196 lb) IBW/kg (Calculated) : 43.2 Heparin Dosing Weight: 64.5 kg  Vital Signs: Temp: 98.3 F (36.8 C) (09/07 1529) Temp Source: Oral (09/07 1529) BP: 111/75 (09/07 1756) Pulse Rate: 128 (09/07 1756)  Labs: Recent Labs    02/05/21 0219 02/05/21 0314 02/05/21 0621 02/05/21 1949 02/06/21 0403 02/06/21 1207 02/06/21 1848  HGB 11.1*  --   --   --  12.0  --   --   HCT 33.4*  --   --   --  36.3  --   --   PLT 244  --   --   --  232  --   --   HEPARINUNFRC  --   --   --    < > 1.03* 0.61 0.39  CREATININE 1.12*  --   --   --  1.08*  --   --   TROPONINIHS  --  15 14  --   --   --   --    < > = values in this interval not displayed.     Estimated Creatinine Clearance: 77.3 mL/min (A) (by C-G formula based on SCr of 1.08 mg/dL (H)).   Medical History: Past Medical History:  Diagnosis Date   Medical history non-contributory     Medications:  Scheduled:   carvedilol  3.125 mg Oral BID WC   furosemide  40 mg Intravenous Q12H   sacubitril-valsartan  1 tablet Oral BID   sodium chloride flush  3 mL Intravenous Q12H   spironolactone  12.5 mg Oral BID   Infusions:   sodium chloride     heparin 1,050 Units/hr (02/06/21 1241)   PRN: sodium chloride, acetaminophen, ondansetron (ZOFRAN) IV, sodium chloride flush  Assessment: 25 yo female presented to ED with cough and shortness of breath found to have new onset HFrEF and now LV thrombus to start IV heparin. Baseline labs obtained  02/06/2021 evening: Confirmatory Heparin level therapeutic at 0.39 on current IV heparin rate of 1050 units/hr CBC stable Per RN, no bleeding or issues to note  Goal of Therapy:  Heparin level 0.3-0.7 units/ml Monitor platelets by anticoagulation protocol: Yes   Plan:  Continue IV heparin at current  rate of 1050 units/hr Daily CBC and heparin level Noted plan for patient to have heart cath tomorrow at [facility]   [facility], PharmD, BCPS Clinical Pharmacist [facility] Please utilize Amion for appropriate phone number to reach the unit pharmacist [facility]) 02/06/2021 8:39 PM

## 2021-02-06 NOTE — Plan of Care (Signed)
  Problem: Education: Goal: Knowledge of General Education information will improve Description: Including pain rating scale, medication(s)/side effects and non-pharmacologic comfort measures Outcome: Progressing   Problem: Clinical Measurements: Goal: Ability to maintain clinical measurements within normal limits will improve Outcome: Progressing Goal: Will remain free from infection Outcome: Progressing Goal: Diagnostic test results will improve Outcome: Progressing Goal: Respiratory complications will improve Outcome: Progressing Goal: Cardiovascular complication will be avoided Outcome: Progressing   Problem: Safety: Goal: Ability to remain free from injury will improve Outcome: Progressing   Problem: Skin Integrity: Goal: Risk for impaired skin integrity will decrease Outcome: Progressing   

## 2021-02-07 ENCOUNTER — Encounter (HOSPITAL_COMMUNITY): Payer: Self-pay | Admitting: Cardiovascular Disease

## 2021-02-07 ENCOUNTER — Inpatient Hospital Stay (HOSPITAL_COMMUNITY)
Admission: EM | Disposition: A | Discharge status: Home / Self Care | Payer: Self-pay | Source: Home / Self Care | Attending: Internal Medicine

## 2021-02-07 HISTORY — PX: RIGHT/LEFT HEART CATH AND CORONARY ANGIOGRAPHY: CATH118266

## 2021-02-07 LAB — BASIC METABOLIC PANEL
Anion gap: 11 (ref 5–15)
BUN: 26 mg/dL — ABNORMAL HIGH (ref 6–20)
CO2: 24 mmol/L (ref 22–32)
Calcium: 9.2 mg/dL (ref 8.9–10.3)
Chloride: 102 mmol/L (ref 98–111)
Creatinine, Ser: 1.11 mg/dL — ABNORMAL HIGH (ref 0.44–1.00)
GFR, Estimated: >60 mL/min (ref >60)
Glucose, Bld: 96 mg/dL (ref 70–99)
Potassium: 3.8 mmol/L (ref 3.5–5.1)
Sodium: 137 mmol/L (ref 135–145)

## 2021-02-07 LAB — POCT I-STAT EG7
Acid-Base Excess: 1 mmol/L (ref 0.0–2.0)
Bicarbonate: 25.9 mmol/L (ref 20.0–28.0)
Calcium, Ion: 1.24 mmol/L (ref 1.15–1.40)
HCT: 37 % (ref 36.0–46.0)
Hemoglobin: 12.6 g/dL (ref 12.0–15.0)
O2 Saturation: 69 %
Potassium: 4 mmol/L (ref 3.5–5.1)
Sodium: 140 mmol/L (ref 135–145)
TCO2: 27 mmol/L (ref 22–32)
pCO2, Ven: 42.7 mmHg — ABNORMAL LOW (ref 44.0–60.0)
pH, Ven: 7.391 (ref 7.250–7.430)
pO2, Ven: 36 mmHg (ref 32.0–45.0)

## 2021-02-07 LAB — CBC
HCT: 36.8 % (ref 36.0–46.0)
Hemoglobin: 12.7 g/dL (ref 12.0–15.0)
MCH: 28.5 pg (ref 26.0–34.0)
MCHC: 34.5 g/dL (ref 30.0–36.0)
MCV: 82.5 fL (ref 80.0–100.0)
Platelets: 245 10*3/uL (ref 150–400)
RBC: 4.46 MIL/uL (ref 3.87–5.11)
RDW: 17.3 % — ABNORMAL HIGH (ref 11.5–15.5)
WBC: 6.6 10*3/uL (ref 4.0–10.5)
nRBC: 0 % (ref 0.0–0.2)

## 2021-02-07 LAB — POCT I-STAT 7, (LYTES, BLD GAS, ICA,H+H)
Acid-Base Excess: 0 mmol/L (ref 0.0–2.0)
Bicarbonate: 24.5 mmol/L (ref 20.0–28.0)
Calcium, Ion: 1.23 mmol/L (ref 1.15–1.40)
HCT: 36 % (ref 36.0–46.0)
Hemoglobin: 12.2 g/dL (ref 12.0–15.0)
O2 Saturation: 98 %
Potassium: 4 mmol/L (ref 3.5–5.1)
Sodium: 140 mmol/L (ref 135–145)
TCO2: 26 mmol/L (ref 22–32)
pCO2 arterial: 37.8 mmHg (ref 32.0–48.0)
pH, Arterial: 7.42 (ref 7.350–7.450)
pO2, Arterial: 99 mmHg (ref 83.0–108.0)

## 2021-02-07 LAB — PROTIME-INR
INR: 1 (ref 0.8–1.2)
Prothrombin Time: 12.9 seconds (ref 11.4–15.2)

## 2021-02-07 LAB — HEPARIN LEVEL (UNFRACTIONATED): Heparin Unfractionated: 0.65 IU/mL (ref 0.30–0.70)

## 2021-02-07 SURGERY — RIGHT/LEFT HEART CATH AND CORONARY ANGIOGRAPHY
Anesthesia: LOCAL

## 2021-02-07 MED ORDER — IOHEXOL 350 MG/ML SOLN
INTRAVENOUS | Status: DC | PRN
  Administered 2021-02-07: 25 mL

## 2021-02-07 MED ORDER — ASPIRIN 81 MG PO CHEW
81.0000 mg | CHEWABLE_TABLET | ORAL | Status: AC
  Administered 2021-02-07: 81 mg via ORAL

## 2021-02-07 MED ORDER — SODIUM CHLORIDE 0.9% FLUSH: 3.0000 mL | INTRAVENOUS | Status: DC | PRN

## 2021-02-07 MED ORDER — SODIUM CHLORIDE 0.9 % WEIGHT BASED INFUSION: 3.0000 mL/kg/h | INTRAVENOUS | Status: DC

## 2021-02-07 MED ORDER — SODIUM CHLORIDE 0.9 % IV SOLN: INTRAVENOUS | Status: AC

## 2021-02-07 MED ORDER — MIDAZOLAM HCL 2 MG/2ML IJ SOLN
INTRAMUSCULAR | Status: AC
  Filled 2021-02-07: qty 2

## 2021-02-07 MED ORDER — HEPARIN (PORCINE) 25000 UT/250ML-% IV SOLN
1050.0000 [IU]/h | INTRAVENOUS | Status: DC
  Administered 2021-02-07 – 2021-02-11 (×5): 1050 [IU]/h via INTRAVENOUS
  Filled 2021-02-07 (×6): qty 250

## 2021-02-07 MED ORDER — HYDRALAZINE HCL 20 MG/ML IJ SOLN: 10.0000 mg | INTRAMUSCULAR | Status: DC | PRN

## 2021-02-07 MED ORDER — HEPARIN (PORCINE) IN NACL 1000-0.9 UT/500ML-% IV SOLN
INTRAVENOUS | Status: DC | PRN
  Administered 2021-02-07 (×2): 500 mL

## 2021-02-07 MED ORDER — SODIUM CHLORIDE 0.9 % IV SOLN: 250.0000 mL | INTRAVENOUS | Status: DC | PRN

## 2021-02-07 MED ORDER — LIDOCAINE HCL (PF) 1 % IJ SOLN
INTRAMUSCULAR | Status: AC
  Filled 2021-02-07: qty 30

## 2021-02-07 MED ORDER — VERAPAMIL HCL 2.5 MG/ML IV SOLN
INTRAVENOUS | Status: AC
  Filled 2021-02-07: qty 2

## 2021-02-07 MED ORDER — SODIUM CHLORIDE 0.9 % IV SOLN: Freq: Once | INTRAVENOUS | Status: DC

## 2021-02-07 MED ORDER — WARFARIN - PHARMACIST DOSING INPATIENT
Freq: Every day | Status: DC
  Administered 2021-02-12: 1

## 2021-02-07 MED ORDER — WARFARIN SODIUM 5 MG PO TABS
7.5000 mg | ORAL_TABLET | Freq: Once | ORAL | Status: AC
  Administered 2021-02-07: 7.5 mg via ORAL
  Filled 2021-02-07: qty 1

## 2021-02-07 MED ORDER — LABETALOL HCL 5 MG/ML IV SOLN: 10.0000 mg | INTRAVENOUS | Status: DC | PRN

## 2021-02-07 MED ORDER — SODIUM CHLORIDE 0.9 % WEIGHT BASED INFUSION: 1.0000 mL/kg/h | INTRAVENOUS | Status: DC

## 2021-02-07 MED ORDER — SPIRONOLACTONE 12.5 MG HALF TABLET
12.5000 mg | ORAL_TABLET | Freq: Every day | ORAL | Status: DC
  Administered 2021-02-08: 12.5 mg via ORAL
  Filled 2021-02-07 (×2): qty 1

## 2021-02-07 MED ORDER — FENTANYL CITRATE (PF) 100 MCG/2ML IJ SOLN
INTRAMUSCULAR | Status: DC | PRN
  Administered 2021-02-07: 25 ug via INTRAVENOUS

## 2021-02-07 MED ORDER — SODIUM CHLORIDE 0.9% FLUSH
3.0000 mL | Freq: Two times a day (BID) | INTRAVENOUS | Status: DC
  Administered 2021-02-11 – 2021-02-13 (×4): 3 mL via INTRAVENOUS

## 2021-02-07 MED ORDER — FUROSEMIDE 20 MG PO TABS
20.0000 mg | ORAL_TABLET | Freq: Every day | ORAL | Status: DC
  Administered 2021-02-08: 20 mg via ORAL
  Filled 2021-02-07: qty 1

## 2021-02-07 MED ORDER — FENTANYL CITRATE (PF) 100 MCG/2ML IJ SOLN
INTRAMUSCULAR | Status: AC
  Filled 2021-02-07: qty 2

## 2021-02-07 MED ORDER — MIDAZOLAM HCL 2 MG/2ML IJ SOLN
INTRAMUSCULAR | Status: DC | PRN
  Administered 2021-02-07 (×3): 1 mg via INTRAVENOUS

## 2021-02-07 MED ORDER — HEPARIN (PORCINE) IN NACL 1000-0.9 UT/500ML-% IV SOLN
INTRAVENOUS | Status: AC
  Filled 2021-02-07: qty 500

## 2021-02-07 SURGICAL SUPPLY — 14 items
CATH BALLN WEDGE 5F 110CM (CATHETERS) ×2 IMPLANT
CATH INFINITI 5 FR 3DRC (CATHETERS) ×2 IMPLANT
CATH INFINITI 5FR MULTPACK ANG (CATHETERS) ×2 IMPLANT
CLOSURE MYNX CONTROL 5F (Vascular Products) ×2 IMPLANT
GLIDESHEATH SLEND SS 6F .021 (SHEATH) ×2 IMPLANT
GUIDEWIRE .025 260CM (WIRE) ×2 IMPLANT
KIT HEART LEFT (KITS) ×2 IMPLANT
NEEDLE SMART 18GX3.5CM LONG (NEEDLE) ×2 IMPLANT
PACK CARDIAC CATHETERIZATION (CUSTOM PROCEDURE TRAY) ×2 IMPLANT
SHEATH GLIDE SLENDER 4/5FR (SHEATH) ×2 IMPLANT
SHEATH PINNACLE 5F 10CM (SHEATH) ×2 IMPLANT
TRANSDUCER W/STOPCOCK (MISCELLANEOUS) ×2 IMPLANT
WIRE EMERALD 3MM-J .025X260CM (WIRE) ×2 IMPLANT
WIRE EMERALD 3MM-J .035X150CM (WIRE) ×2 IMPLANT

## 2021-02-07 NOTE — Progress Notes (Addendum)
PROGRESS NOTE  Anna [name]  DOB: [birth year]  PCP: Patient, No Pcp Per (Inactive) EYC:[id number]  DOA: 02/05/2021  LOS: 2 days  Hospital Day: 3   Chief Complaint  Patient presents with   Shortness of Breath    Brief narrative: Anna [name] is a 25 y.o. female with no significant past medical history who presented to the ED last night with complaint of 4 to 5 days of nonproductive cough, shortness of breath, palpitation.  She had an episode of fever, chills, generalized aches, and sore throat on August 19 after exposure to her stepdad who had COVID though she was not tested.  She recovered from that illness before the current symptoms developed insidiously over the past few days.    In the ED, patient was afebrile, oxygen saturation low 90s, mildly tachypneic, tachycardic to 130s with blood pressure 130/67.   EKG features sinus tachycardia with rate 127.   Chest x-ray showed cardiomegaly and diffuse bilateral airspace opacities concerning for edema.  CTA chest was negative for PE but notable for mild interstitial and alveolar pulmonary edema and small right pleural effusion.   She was given IV Lasix, admitted to hospitalist service Echocardiogram showed EF less than 20% with LV mural thrombus. Cardiology consultation was obtained with Dr. Algie Coffer.  Subjective: Patient was seen and examined this afternoon. Pleasant young African-American female. Propped up in bed.  Not in distress.  Mom at bedside. Patient underwent cardiac cath today.  Normal coronaries.  Assessment/Plan: New-onset CHF  -Presented with 4 to 5 days of progressive shortness of breath, nonproductive cough, palpitation.  Chest x-ray with pulm edema, BNP elevated. -Echocardiogram with EF less than 20%  -Currently on Lasix, Coreg and Entresto. -Cardiology following. -Patient underwent left and right heart cath today.  Normal coronaries.  LV mural thrombus -Possibility of LV mural thrombus noted in  echocardiogram. -Discussed with cardiology Dr. Algie Coffer on 9/6.  Started on heparin drip. Plan to start coumadin from tonight. Target INR 2.5-3.5. DOAC not indicated per cardiology.  ?No medical insurance -No information about her medical insurance in the chart.  However patient's mom believes that her medical insurance covers her daughter as well.  She is checking with her caseworker.  -If not, with a diagnosis of severe CHF, patient may qualify for needing Medicaid application as well.  She will be helped with coupons for essential medications at discharge.  TOC consulted  Mobility: Encourage ambulation Code Status:   Code Status: Full Code  Nutritional status: Body mass index is 39.36 kg/m.     Diet:  Diet Order             Diet Heart Room service appropriate? Yes; Fluid consistency: Thin  Diet effective now                  DVT prophylaxis: Heparin drip   Antimicrobials: None Fluid: None Consultants: Cardiology Family Communication: None at bedside  Status is: Inpatient  Remains inpatient appropriate because: Needs IV Lasix, cardiac cath today.  Dispo: The patient is from: Home              Anticipated d/c is to: Home in 1 to 2 days              Patient currently is not medically stable to d/c.   Difficult to place patient No     Infusions:   sodium chloride     sodium chloride     heparin 1,050 Units/hr (02/06/21 1241)    Scheduled  Meds:  carvedilol  3.125 mg Oral BID WC   [START ON 02/08/2021] furosemide  20 mg Oral Daily   sacubitril-valsartan  1 tablet Oral BID   sodium chloride flush  3 mL Intravenous Q12H   [START ON 02/08/2021] spironolactone  12.5 mg Oral Daily    Antimicrobials: Anti-infectives (From admission, onward)    None       PRN meds: sodium chloride, acetaminophen, ondansetron (ZOFRAN) IV, sodium chloride flush   Objective: Vitals:   02/07/21 1355 02/07/21 1456  BP: 93/62 (!) 82/55  Pulse: (!) 103 99  Resp: (!) 24 18  Temp:   98.2 F (36.8 C)  SpO2: 98% 100%    Intake/Output Summary (Last 24 hours) at 02/07/2021 1619 Last data filed at 02/06/2021 1846 Gross per 24 hour  Intake 240 ml  Output --  Net 240 ml   Filed Weights   02/05/21 0219 02/07/21 0500  Weight: 88.9 kg 88.4 kg   Weight change:  Body mass index is 39.36 kg/m.   Physical Exam: General exam: Pleasant young African-American female.  Not in physical distress Skin: No rashes, lesions or ulcers. HEENT: Atraumatic, normocephalic, no obvious bleeding Lungs: Clear to auscultation bilaterally CVS: Regular rate and rhythm, no murmur GI/Abd soft, nontender, nondistended, bowel sound present CNS: Alert, awake, oriented x3 Psychiatry: Mood appropriate Extremities: No pedal edema, no calf tenderness  Data Review: I have personally reviewed the laboratory data and studies available.  Recent Labs  Lab 02/05/21 0219 02/06/21 0403 02/07/21 0351 02/07/21 1058 02/07/21 1139  WBC 7.4 6.8 6.6  --   --   NEUTROABS 3.8  --   --   --   --   HGB 11.1* 12.0 12.7 12.6 12.2  HCT 33.4* 36.3 36.8 37.0 36.0  MCV 85.0 83.6 82.5  --   --   PLT 244 232 245  --   --    Recent Labs  Lab 02/05/21 0219 02/05/21 0314 02/06/21 0403 02/07/21 0351 02/07/21 1058 02/07/21 1139  NA 137  --  135 137 140 140  K 3.7  --  3.8 3.8 4.0 4.0  CL 109  --  101 102  --   --   CO2 21*  --  22 24  --   --   GLUCOSE 113*  --  91 96  --   --   BUN 10  --  15 26*  --   --   CREATININE 1.12*  --  1.08* 1.11*  --   --   CALCIUM 8.8*  --  9.1 9.2  --   --   MG  --  1.8 2.3  --   --   --     F/u labs ordered Unresulted Labs (From admission, onward)     Start     Ordered   02/07/21 0500  Heparin level (unfractionated)  Daily,   R      02/06/21 2041   02/06/21 0500  Basic metabolic panel  Daily,   R      02/05/21 0614   02/06/21 0500  CBC  Daily,   R      02/05/21 1324            Signed, Lorin Glass, MD [facility] 02/07/2021

## 2021-02-07 NOTE — Consult Note (Addendum)
Ref: Patient, No Pcp Per (Inactive)   Subjective:  Awake. Cardiac cath showed low right and left heart pressures with normal coronaries.  Objective:  Vital Signs in the last 24 hours: Temp:  [97.6 F (36.4 C)-98.7 F (37.1 C)] 98.2 F (36.8 C) (09/08 1456) Pulse Rate:  [0-128] 99 (09/08 1456) Cardiac Rhythm: Sinus tachycardia (09/08 1217) Resp:  [0-47] 18 (09/08 1456) BP: (82-111)/(47-82) 82/55 (09/08 1456) SpO2:  [0 %-100 %] 100 % (09/08 1456) Weight:  [88.4 kg] 88.4 kg (09/08 0500)  Physical Exam: BP Readings from Last 1 Encounters:  02/07/21 (!) 82/55     Wt Readings from Last 1 Encounters:  02/07/21 88.4 kg    Weight change:  Body mass index is 39.36 kg/m. HEENT: [place]/AT, Eyes-Brown, Conjunctiva-Pink, Sclera-Non-icteric Neck: No JVD, No bruit, Trachea midline. Lungs:  Clear, Bilateral. Cardiac:  Regular rhythm, normal S1 and S2, + S3. II/VI systolic murmur. Abdomen:  Soft, non-tender. BS present. Extremities:  No edema present. No cyanosis. No clubbing. CNS: AxOx3, Cranial nerves grossly intact, moves all 4 extremities.  Skin: Warm and dry.   Intake/Output from previous day: 09/07 0701 - 09/08 0700 In: 480 [P.O.:480] Out: -     Lab Results: BMET    Component Value Date/Time   NA 140 02/07/2021 1139   NA 140 02/07/2021 1058   NA 137 02/07/2021 0351   K 4.0 02/07/2021 1139   K 4.0 02/07/2021 1058   K 3.8 02/07/2021 0351   CL 102 02/07/2021 0351   CL 101 02/06/2021 0403   CL 109 02/05/2021 0219   CO2 24 02/07/2021 0351   CO2 22 02/06/2021 0403   CO2 21 (L) 02/05/2021 0219   GLUCOSE 96 02/07/2021 0351   GLUCOSE 91 02/06/2021 0403   GLUCOSE 113 (H) 02/05/2021 0219   BUN 26 (H) 02/07/2021 0351   BUN 15 02/06/2021 0403   BUN 10 02/05/2021 0219   CREATININE 1.11 (H) 02/07/2021 0351   CREATININE 1.08 (H) 02/06/2021 0403   CREATININE 1.12 (H) 02/05/2021 0219   CALCIUM 9.2 02/07/2021 0351   CALCIUM 9.1 02/06/2021 0403   CALCIUM 8.8 (L) 02/05/2021 0219    GFRNONAA >60 02/07/2021 0351   GFRNONAA >60 02/06/2021 0403   GFRNONAA >60 02/05/2021 0219   GFRAA >60 03/03/2020 0010   CBC    Component Value Date/Time   WBC 6.6 02/07/2021 0351   RBC 4.46 02/07/2021 0351   HGB 12.2 02/07/2021 1139   HCT 36.0 02/07/2021 1139   PLT 245 02/07/2021 0351   MCV 82.5 02/07/2021 0351   MCH 28.5 02/07/2021 0351   MCHC 34.5 02/07/2021 0351   RDW 17.3 (H) 02/07/2021 0351   LYMPHSABS 2.7 02/05/2021 0219   MONOABS 0.7 02/05/2021 0219   EOSABS 0.1 02/05/2021 0219   BASOSABS 0.0 02/05/2021 0219   HEPATIC Function Panel Recent Labs    03/03/20 0010 02/05/21 0314 02/06/21 0403  PROT 7.5 7.1 7.4   HEMOGLOBIN A1C No components found for: HGA1C,  MPG CARDIAC ENZYMES No results found for: CKTOTAL, CKMB, CKMBINDEX, TROPONINI BNP No results for input(s): PROBNP in the last 8760 hours. TSH Recent Labs    02/05/21 0621  TSH 1.950   CHOLESTEROL No results for input(s): CHOL in the last 8760 hours.  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 Decrease spironolactone by 50 % for low BP. Start warfarin for LV apical clot. INR goal 2.5 to 3.5 Discussed diet, activity and medications again.   LOS: 2 days   Time spent including chart review, lab review, examination, discussion with patient/Nurse/Mother/Pharmacy/Physician : 30 min   [name]   MD  02/07/2021, 4:04 PM

## 2021-02-07 NOTE — Progress Notes (Signed)
Pt returned at this time from [facility] main where heart cath procedure was completed. Pt stable at this time. R groin CDI. Will continue to monitor.

## 2021-02-07 NOTE — Progress Notes (Signed)
ANTICOAGULATION CONSULT NOTE - Follow Up Consult  Pharmacy Consult for IV Heparin Indication:  LV thrombus  No Known Allergies  Patient Measurements: Height: 4\' 11"  (149.9 cm) Weight: 88.4 kg (194 lb 14.2 oz) IBW/kg (Calculated) : 43.2 Heparin Dosing Weight: 64.5 kg  Vital Signs: Temp: 98.2 F (36.8 C) (09/08 1456) Temp Source: Oral (09/08 1456) BP: 82/55 (09/08 1456) Pulse Rate: 99 (09/08 1456)  Labs: Recent Labs    02/05/21 0219 02/05/21 0314 02/05/21 0621 02/05/21 1949 02/06/21 0403 02/06/21 1207 02/06/21 1848 02/07/21 0351 02/07/21 1058 02/07/21 1139  HGB 11.1*  --   --   --  12.0  --   --  12.7 12.6 12.2  HCT 33.4*  --   --   --  36.3  --   --  36.8 37.0 36.0  PLT 244  --   --   --  232  --   --  245  --   --   HEPARINUNFRC  --   --   --    < > 1.03* 0.61 0.39 0.65  --   --   CREATININE 1.12*  --   --   --  1.08*  --   --  1.11*  --   --   TROPONINIHS  --  15 14  --   --   --   --   --   --   --    < > = values in this interval not displayed.     Estimated Creatinine Clearance: 75 mL/min (A) (by C-G formula based on SCr of 1.11 mg/dL (H)).   Medical History: Past Medical History:  Diagnosis Date   Medical history non-contributory     Medications:  Scheduled:   carvedilol  3.125 mg Oral BID WC   [START ON 02/08/2021] furosemide  20 mg Oral Daily   sacubitril-valsartan  1 tablet Oral BID   sodium chloride flush  3 mL Intravenous Q12H   sodium chloride flush  3 mL Intravenous Q12H   spironolactone  12.5 mg Oral BID   Infusions:   sodium chloride     sodium chloride     sodium chloride     heparin 1,050 Units/hr (02/06/21 1241)   PRN: sodium chloride, sodium chloride, acetaminophen, hydrALAZINE, labetalol, ondansetron (ZOFRAN) IV, sodium chloride flush, sodium chloride flush  Assessment: 25 yo female presented to ED with cough and shortness of breath found to have new onset HFrEF and now LV thrombus to start IV heparin. Baseline labs  obtained  Today, 02/07/2021: Now s/p cardiac cath; no ischemia found Heparin paused for cath, per Dr. 04/09/2021 to resume at 8pm AM heparin level therapeutic at 0.65 on 1050 units/hr prior to pausing CBC stable WNL SCr slightly elevated but stable Per RN, no bleeding or infusion issues; sheath removal site intact  Goal of Therapy:  Heparin level 0.3-0.7 units/ml INR 2-3 Monitor platelets by anticoagulation protocol: Yes   Plan:  Resume IV heparin at previous rate of 1050 units/hr, starting at 8pm today Warfarin 7.5 mg PO tonight x 1 Daily CBC, heparin level, and INR F/u plans for discharge; can use LMWH to bridge warfarin to facilitate discharging pt home while INR remains subtherapeutic  Algie Coffer, PharmD, BCPS [phone] 02/07/2021, 4:04 PM

## 2021-02-07 NOTE — Interval H&P Note (Signed)
History and Physical Interval Note:  02/07/2021 10:24 AM  Anna [name]  has presented today for surgery, with the diagnosis of nstemi.  The various methods of treatment have been discussed with the patient and family. After consideration of risks, benefits and other options for treatment, the patient has consented to  Procedure(s): RIGHT/LEFT HEART CATH AND CORONARY ANGIOGRAPHY (N/A) as a surgical intervention.  The patient's history has been reviewed, patient examined, no change in status, stable for surgery.  I have reviewed the patient's chart and labs.  Questions were answered to the patient's satisfaction.     Ricki Rodriguez

## 2021-02-07 NOTE — Progress Notes (Signed)
This Nurse called and informed Cath Lab that patient is on the schedule for Heart Cath at 0900. Informed cath lab that patient does not have any orders in place for procedure, to obtain consent, nor for patient to be npo after midnight. However, patient has been npo.   [facility] has also been called by this nurse to arrange transportation to [facility].

## 2021-02-07 NOTE — Progress Notes (Signed)
ANTICOAGULATION CONSULT NOTE - Follow Up Consult  Pharmacy Consult for IV Heparin Indication:  LV thrombus  No Known Allergies  Patient Measurements: Height: 4\' 11"  (149.9 cm) Weight: 88.4 kg (194 lb 14.2 oz) IBW/kg (Calculated) : 43.2 Heparin Dosing Weight: 64.5 kg  Vital Signs: Temp: 98.7 F (37.1 C) (09/08 0606) Temp Source: Oral (09/08 0606) BP: 98/67 (09/08 0606) Pulse Rate: 109 (09/08 0606)  Labs: Recent Labs    02/05/21 0219 02/05/21 0314 02/05/21 04/07/21 02/05/21 1949 02/06/21 0403 02/06/21 1207 02/06/21 1848 02/07/21 0351  HGB 11.1*  --   --   --  12.0  --   --  12.7  HCT 33.4*  --   --   --  36.3  --   --  36.8  PLT 244  --   --   --  232  --   --  245  HEPARINUNFRC  --   --   --    < > 1.03* 0.61 0.39 0.65  CREATININE 1.12*  --   --   --  1.08*  --   --  1.11*  TROPONINIHS  --  15 14  --   --   --   --   --    < > = values in this interval not displayed.     Estimated Creatinine Clearance: 75 mL/min (A) (by C-G formula based on SCr of 1.11 mg/dL (H)).   Medical History: Past Medical History:  Diagnosis Date   Medical history non-contributory     Medications:  Scheduled:   aspirin  81 mg Oral Pre-Cath   carvedilol  3.125 mg Oral BID WC   furosemide  40 mg Intravenous Q12H   sacubitril-valsartan  1 tablet Oral BID   sodium chloride flush  3 mL Intravenous Q12H   spironolactone  12.5 mg Oral BID   Infusions:   sodium chloride     sodium chloride     heparin 1,050 Units/hr (02/06/21 1241)   PRN: sodium chloride, acetaminophen, ondansetron (ZOFRAN) IV, sodium chloride flush  Assessment: 25 yo female presented to ED with cough and shortness of breath found to have new onset HFrEF and now LV thrombus to start IV heparin. Baseline labs obtained  02/07/2021 evening:  Heparin level therapeutic at 0.65 on current IV heparin rate of 1050 units/hr CBC stable Scr 1.11 mg/dl, CrCl 75 ml/min  Per RN, no bleeding or issues to note  Goal of Therapy:   Heparin level 0.3-0.7 units/ml Monitor platelets by anticoagulation protocol: Yes   Plan:  Continue IV heparin at current rate of 1050 units/hr Daily CBC and heparin level Noted plan for patient to have heart cath today  04/09/2021, PharmD, BCPS 02/07/2021 8:47 AM

## 2021-02-08 LAB — CBC
HCT: 34.1 % — ABNORMAL LOW (ref 36.0–46.0)
Hemoglobin: 11.6 g/dL — ABNORMAL LOW (ref 12.0–15.0)
MCH: 28.4 pg (ref 26.0–34.0)
MCHC: 34 g/dL (ref 30.0–36.0)
MCV: 83.4 fL (ref 80.0–100.0)
Platelets: 208 10*3/uL (ref 150–400)
RBC: 4.09 MIL/uL (ref 3.87–5.11)
RDW: 17.5 % — ABNORMAL HIGH (ref 11.5–15.5)
WBC: 5.9 10*3/uL (ref 4.0–10.5)
nRBC: 0 % (ref 0.0–0.2)

## 2021-02-08 LAB — PROTIME-INR
INR: 1.1 (ref 0.8–1.2)
Prothrombin Time: 14 seconds (ref 11.4–15.2)

## 2021-02-08 LAB — BASIC METABOLIC PANEL
Anion gap: 9 (ref 5–15)
BUN: 19 mg/dL (ref 6–20)
CO2: 23 mmol/L (ref 22–32)
Calcium: 8.5 mg/dL — ABNORMAL LOW (ref 8.9–10.3)
Chloride: 105 mmol/L (ref 98–111)
Creatinine, Ser: 0.95 mg/dL (ref 0.44–1.00)
GFR, Estimated: >60 mL/min (ref >60)
Glucose, Bld: 89 mg/dL (ref 70–99)
Potassium: 4.2 mmol/L (ref 3.5–5.1)
Sodium: 137 mmol/L (ref 135–145)

## 2021-02-08 LAB — HEPARIN LEVEL (UNFRACTIONATED): Heparin Unfractionated: 0.5 IU/mL (ref 0.30–0.70)

## 2021-02-08 MED ORDER — WARFARIN SODIUM 5 MG PO TABS
7.5000 mg | ORAL_TABLET | Freq: Once | ORAL | Status: AC
  Administered 2021-02-08: 7.5 mg via ORAL
  Filled 2021-02-08: qty 1

## 2021-02-08 MED ORDER — CARVEDILOL 6.25 MG PO TABS
6.2500 mg | ORAL_TABLET | Freq: Two times a day (BID) | ORAL | Status: DC
  Administered 2021-02-08 – 2021-02-09 (×2): 6.25 mg via ORAL
  Filled 2021-02-08 (×2): qty 1

## 2021-02-08 MED FILL — Lidocaine HCl Local Preservative Free (PF) Inj 1%: INTRAMUSCULAR | Qty: 30 | Status: AC

## 2021-02-08 MED FILL — Verapamil HCl IV Soln 2.5 MG/ML: INTRAVENOUS | Qty: 2 | Status: AC

## 2021-02-08 NOTE — Consult Note (Signed)
Ref: Patient, No Pcp Per (Inactive)   Subjective:  Awake. VS stable. Sinus tachycardia with activity. BP is low but improved. Creatinine 0.95. Patient aware of normal coronaries. Patient aware of follow up needed for heart function and INR till stable for 3-6 months  Objective:  Vital Signs in the last 24 hours: Temp:  [98.2 F (36.8 C)-98.5 F (36.9 C)] 98.4 F (36.9 C) (09/09 0458) Pulse Rate:  [0-116] 101 (09/09 0458) Cardiac Rhythm: Sinus tachycardia (09/09 0940) Resp:  [0-47] 16 (09/09 0458) BP: (82-108)/(48-71) 104/71 (09/09 0458) SpO2:  [0 %-100 %] 98 % (09/09 0458)  Physical Exam: BP Readings from Last 1 Encounters:  02/08/21 104/71     Wt Readings from Last 1 Encounters:  02/07/21 88.4 kg    Weight change:  Body mass index is 39.36 kg/m. HEENT: [place]/AT, Eyes-Brown, Conjunctiva-Pink, Sclera-Non-icteric Neck: No JVD, No bruit, Trachea midline. Lungs:  Clear, Bilateral. Cardiac:  Regular rhythm, normal S1 and S2, + S3. II/VI systolic murmur. Abdomen:  Soft, non-tender. BS present. Extremities:  No edema present. No cyanosis. No clubbing. Right groin mildly tender without swelling or ecchymosis or discharge. Right wrist has good pulse. CNS: AxOx3, Cranial nerves grossly intact, moves all 4 extremities.  Skin: Warm and dry.   Intake/Output from previous day: 09/08 0701 - 09/09 0700 In: 311.6 [P.O.:240; I.V.:71.6] Out: 900 [Urine:900]    Lab Results: BMET    Component Value Date/Time   NA 137 02/08/2021 0346   NA 140 02/07/2021 1139   NA 140 02/07/2021 1058   K 4.2 02/08/2021 0346   K 4.0 02/07/2021 1139   K 4.0 02/07/2021 1058   CL 105 02/08/2021 0346   CL 102 02/07/2021 0351   CL 101 02/06/2021 0403   CO2 23 02/08/2021 0346   CO2 24 02/07/2021 0351   CO2 22 02/06/2021 0403   GLUCOSE 89 02/08/2021 0346   GLUCOSE 96 02/07/2021 0351   GLUCOSE 91 02/06/2021 0403   BUN 19 02/08/2021 0346   BUN 26 (H) 02/07/2021 0351   BUN 15 02/06/2021 0403    CREATININE 0.95 02/08/2021 0346   CREATININE 1.11 (H) 02/07/2021 0351   CREATININE 1.08 (H) 02/06/2021 0403   CALCIUM 8.5 (L) 02/08/2021 0346   CALCIUM 9.2 02/07/2021 0351   CALCIUM 9.1 02/06/2021 0403   GFRNONAA >60 02/08/2021 0346   GFRNONAA >60 02/07/2021 0351   GFRNONAA >60 02/06/2021 0403   GFRAA >60 03/03/2020 0010   CBC    Component Value Date/Time   WBC 5.9 02/08/2021 0346   RBC 4.09 02/08/2021 0346   HGB 11.6 (L) 02/08/2021 0346   HCT 34.1 (L) 02/08/2021 0346   PLT 208 02/08/2021 0346   MCV 83.4 02/08/2021 0346   MCH 28.4 02/08/2021 0346   MCHC 34.0 02/08/2021 0346   RDW 17.5 (H) 02/08/2021 0346   LYMPHSABS 2.7 02/05/2021 0219   MONOABS 0.7 02/05/2021 0219   EOSABS 0.1 02/05/2021 0219   BASOSABS 0.0 02/05/2021 0219   HEPATIC Function Panel Recent Labs    03/03/20 0010 02/05/21 0314 02/06/21 0403  PROT 7.5 7.1 7.4   HEMOGLOBIN A1C No components found for: HGA1C,  MPG CARDIAC ENZYMES No results found for: CKTOTAL, CKMB, CKMBINDEX, TROPONINI BNP No results for input(s): PROBNP in the last 8760 hours. TSH Recent Labs    02/05/21 0621  TSH 1.950   CHOLESTEROL No results for input(s): CHOL in the last 8760 hours.  Scheduled Meds:  carvedilol  3.125 mg Oral BID WC   furosemide  20 mg Oral Daily   sacubitril-valsartan  1 tablet Oral BID   sodium chloride flush  3 mL Intravenous Q12H   spironolactone  12.5 mg Oral Daily   warfarin  7.5 mg Oral ONCE-1600   Warfarin - Pharmacist Dosing Inpatient   Does not apply q1600   Continuous Infusions:  sodium chloride     heparin 1,050 Units/hr (02/07/21 2007)   PRN Meds:.sodium chloride, acetaminophen, ondansetron (ZOFRAN) IV, sodium chloride flush  Assessment/Plan:  Acute systolic left heart failure, HFrEF Dilated and non-ischemic cardiomyopathy LV apical clot Obesity Alcohol use disorder Marijuana use disorder  Plan: Continue warfarin per pharmacy. Increase Coreg as tolerated. F/U in 1 week post  discharge. INR goal 2.5 to 3.5. Home when INR over 2.0 if stable. Dr. Sharyn Lull covering over weekend if needed.   LOS: 3 days   Time spent including chart review, lab review, examination, discussion with patient/Nurse : 30 min   [name]  MD  02/08/2021, 10:47 AM

## 2021-02-08 NOTE — Progress Notes (Signed)
PROGRESS NOTE  Anna [name]  DOB: 06/21/1995  PCP: Patient, No Pcp Per (Inactive) [id number]  DOA: 02/05/2021  LOS: 3 days  Hospital Day: 4   Chief Complaint  Patient presents with   Shortness of Breath    Brief narrative: Anna [name] is a 25 y.o. female with no significant past medical history who presented to the ED last night with complaint of 4 to 5 days of nonproductive cough, shortness of breath, palpitation.  She had an episode of fever, chills, generalized aches, and sore throat on August 19 after exposure to her stepdad who had COVID though she was not tested.  She recovered from that illness before the current symptoms developed insidiously over the past few days.    In the ED, patient was afebrile, oxygen saturation low 90s, mildly tachypneic, tachycardic to 130s with blood pressure 130/67.   EKG features sinus tachycardia with rate 127.   Chest x-ray showed cardiomegaly and diffuse bilateral airspace opacities concerning for edema.  CTA chest was negative for PE but notable for mild interstitial and alveolar pulmonary edema and small right pleural effusion.   She was given IV Lasix, admitted to hospitalist service Echocardiogram showed EF less than 20% with LV mural thrombus. Cardiology consultation was obtained with Dr. Algie Coffer.  Subjective: Patient was seen and examined this morning.  Sleeping, opens eyes on verbal command. Pleasant young African-American female. Lying on bed.  Not in distress.  No new symptoms.  Assessment/Plan: New-onset CHF  -Presented with 4 to 5 days of progressive shortness of breath, nonproductive cough, palpitation.  Chest x-ray with pulm edema, BNP elevated. -Echocardiogram with EF less than 20%  -On 9/8, patient underwent cardiac cath, normal coronaries found. -Adequately diuresed with Lasix.  Currently on Coreg, Entresto and Aldactone. -Cardiology following.  LV mural thrombus -LV mural thrombus noted in echocardiogram. -Patient  was started on heparin drip.  Coumadin has been initiated as well.  Per cardiology, target INR 2.5-3.5. DOAC not indicated.  No medical insurance -Patient has no medical insurance.  Based on her severe CHF, Medicaid application was started.  For now, [place] to help her with coupons for essential medicines.  Mobility: Encourage ambulation Code Status:   Code Status: Full Code  Nutritional status: Body mass index is 39.36 kg/m.     Diet:  Diet Order             Diet Heart Room service appropriate? Yes; Fluid consistency: Thin  Diet effective now                  DVT prophylaxis: Heparin drip warfarin (COUMADIN) tablet 7.5 mg   Antimicrobials: None Fluid: None Consultants: Cardiology Family Communication: None at bedside  Status is: Inpatient  Remains inpatient appropriate because: Continues to require inpatient monitoring, on heparin drip until INR is between 2.5-3.5  Dispo: The patient is from: Home              Anticipated d/c is to: Home in 1 to 2 days              Patient currently is not medically stable to d/c.   Difficult to place patient No     Infusions:   sodium chloride     heparin 1,050 Units/hr (02/07/21 2007)    Scheduled Meds:  carvedilol  6.25 mg Oral BID WC   furosemide  20 mg Oral Daily   sacubitril-valsartan  1 tablet Oral BID   sodium chloride flush  3 mL Intravenous Q12H  spironolactone  12.5 mg Oral Daily   warfarin  7.5 mg Oral ONCE-1600   Warfarin - Pharmacist Dosing Inpatient   Does not apply q1600    Antimicrobials: Anti-infectives (From admission, onward)    None       PRN meds: sodium chloride, acetaminophen, ondansetron (ZOFRAN) IV, sodium chloride flush   Objective: Vitals:   02/07/21 2246 02/08/21 0458  BP:  104/71  Pulse: (!) 105 (!) 101  Resp:  16  Temp:  98.4 F (36.9 C)  SpO2:  98%    Intake/Output Summary (Last 24 hours) at 02/08/2021 1430 Last data filed at 02/08/2021 0300 Gross per 24 hour  Intake  311.64 ml  Output 900 ml  Net -588.36 ml    Filed Weights   02/05/21 0219 02/07/21 0500  Weight: 88.9 kg 88.4 kg   Weight change:  Body mass index is 39.36 kg/m.   Physical Exam: General exam: Pleasant young African-American female.  Not in physical distress Skin: No rashes, lesions or ulcers. HEENT: Atraumatic, normocephalic, no obvious bleeding Lungs: Clear to auscultation bilaterally CVS: Regular rate and rhythm, no murmur GI/Abd soft, nontender, nondistended, bowel sound present CNS: Alert, awake, oriented x3 Psychiatry: Mood appropriate Extremities: No pedal edema, no calf tenderness  Data Review: I have personally reviewed the laboratory data and studies available.  Recent Labs  Lab 02/05/21 0219 02/06/21 0403 02/07/21 0351 02/07/21 1058 02/07/21 1139 02/08/21 0346  WBC 7.4 6.8 6.6  --   --  5.9  NEUTROABS 3.8  --   --   --   --   --   HGB 11.1* 12.0 12.7 12.6 12.2 11.6*  HCT 33.4* 36.3 36.8 37.0 36.0 34.1*  MCV 85.0 83.6 82.5  --   --  83.4  PLT 244 232 245  --   --  208    Recent Labs  Lab 02/05/21 0219 02/05/21 0314 02/06/21 0403 02/07/21 0351 02/07/21 1058 02/07/21 1139 02/08/21 0346  NA 137  --  135 137 140 140 137  K 3.7  --  3.8 3.8 4.0 4.0 4.2  CL 109  --  101 102  --   --  105  CO2 21*  --  22 24  --   --  23  GLUCOSE 113*  --  91 96  --   --  89  BUN 10  --  15 26*  --   --  19  CREATININE 1.12*  --  1.08* 1.11*  --   --  0.95  CALCIUM 8.8*  --  9.1 9.2  --   --  8.5*  MG  --  1.8 2.3  --   --   --   --      F/u labs ordered Unresulted Labs (From admission, onward)     Start     Ordered   02/08/21 0500  Protime-INR  Daily,   R      02/07/21 1630   02/07/21 0500  Heparin level (unfractionated)  Daily,   R      02/06/21 2041   02/06/21 0500  Basic metabolic panel  Daily,   R      02/05/21 0614   02/06/21 0500  CBC  Daily,   R      02/05/21 1324            Signed, Lorin Glass, MD [facility] 02/08/2021

## 2021-02-08 NOTE — Progress Notes (Addendum)
ANTICOAGULATION CONSULT NOTE - Follow Up Consult  Pharmacy Consult for IV Heparin/warfarin Indication:  LV thrombus  No Known Allergies  Patient Measurements: Height: 4\' 11"  (149.9 cm) Weight: 88.4 kg (194 lb 14.2 oz) IBW/kg (Calculated) : 43.2 Heparin Dosing Weight: 64.5 kg  Vital Signs: Temp: 98.4 F (36.9 C) (09/09 0458) Temp Source: Oral (09/09 0458) BP: 104/71 (09/09 0458) Pulse Rate: 101 (09/09 0458)  Labs: Recent Labs    02/06/21 0403 02/06/21 1207 02/06/21 1848 02/07/21 0351 02/07/21 1058 02/07/21 1139 02/07/21 1657 02/08/21 0346  HGB 12.0  --   --  12.7 12.6 12.2  --  11.6*  HCT 36.3  --   --  36.8 37.0 36.0  --  34.1*  PLT 232  --   --  245  --   --   --  208  LABPROT  --   --   --   --   --   --  12.9 14.0  INR  --   --   --   --   --   --  1.0 1.1  HEPARINUNFRC 1.03*   < > 0.39 0.65  --   --   --  0.50  CREATININE 1.08*  --   --  1.11*  --   --   --  0.95   < > = values in this interval not displayed.     Estimated Creatinine Clearance: 87.6 mL/min (by C-G formula based on SCr of 0.95 mg/dL).   Medical History: Past Medical History:  Diagnosis Date   Medical history non-contributory     Medications:  Scheduled:   carvedilol  3.125 mg Oral BID WC   furosemide  20 mg Oral Daily   sacubitril-valsartan  1 tablet Oral BID   sodium chloride flush  3 mL Intravenous Q12H   spironolactone  12.5 mg Oral Daily   Warfarin - Pharmacist Dosing Inpatient   Does not apply q1600   Infusions:   sodium chloride     heparin 1,050 Units/hr (02/07/21 2007)   PRN: sodium chloride, acetaminophen, ondansetron (ZOFRAN) IV, sodium chloride flush  Assessment: 25 yo female presented to ED with cough and shortness of breath found to have new onset HFrEF and now LV thrombus to start IV heparin. Baseline labs obtained  Today, 02/08/2021: Now s/p cardiac cath; no ischemia found Heparin level therapeutic on current IV heparin rate of 1050 units/hr INR SUBtherapeutic as  expected after 7.5mg  warfarin x 1 dose CBC stable Per RN, no bleeding or infusion issues; sheath removal site intact  Goal of Therapy:  Heparin level 0.3-0.7 units/ml INR 2-3 Monitor platelets by anticoagulation protocol: Yes   Plan:  Today will be Day 2 of overlap of heparin/warfarin Continue IV heparin at current rate of 1050 units/hr Repeat warfarin 7.5 mg PO tonight x 1 Daily CBC, heparin level, and INR F/u plans for discharge; can use LMWH to bridge warfarin to facilitate discharging pt home while INR remains subtherapeutic Plan warfarin education prior to discharge   04/10/2021, PharmD, BCPS Secure Chat if ?s 02/08/2021 10:09 AM

## 2021-02-08 NOTE — TOC Progression Note (Signed)
Transition of Care [facility]) - Progression Note    Patient Details  Name: Tereka Thorley MRN: [id number] Date of Birth: Aug 20, 1995  Transition of Care [facility]) CM/SW Contact  Geni Bers, RN Phone Number: 02/08/2021, 2:41 PM  Clinical Narrative:    A call was made to pt concerning PCP and medications. Pt is from [place] and plan to return. Pt's mother live in [place]. Explained to pt that she will need to follow up with a doctor or go to an Urgent care until she move back to [place]. Pt allowed this CM to setup a follow up appointment with [facility] on 9/13 at 1PM. Pt states that she will get her medications and asked that scripts be sent to [facility] [address], [place], [place] [phone].    Expected Discharge Plan: Home/Self Care Barriers to Discharge: No Barriers Identified  Expected Discharge Plan and Services Expected Discharge Plan: Home/Self Care       Living arrangements for the past 2 months: Single Family Home                                       Social Determinants of Health (SDOH) Interventions    Readmission Risk Interventions No flowsheet data found.

## 2021-02-08 NOTE — Discharge Instructions (Signed)
Information on my medicine - Coumadin   (Warfarin)  This medication education was reviewed with me or my healthcare representative as part of my discharge preparation.  The pharmacist that spoke with me during my hospital stay was:  Johnston Ebbs, Student-PharmD  Why was Coumadin prescribed for you? Coumadin was prescribed for you because you have a blood clot or a medical condition that can cause an increased risk of forming blood clots. Blood clots can cause serious health problems by blocking the flow of blood to the heart, lung, or brain. Coumadin can prevent harmful blood clots from forming. As a reminder your indication for Coumadin is: Blood Clot in Heart  What test will check on my response to Coumadin? While on Coumadin (warfarin) you will need to have an INR test regularly to ensure that your dose is keeping you in the desired range. The INR (international normalized ratio) number is calculated from the result of the laboratory test called prothrombin time (PT).  If an INR APPOINTMENT HAS NOT ALREADY BEEN MADE FOR YOU please schedule an appointment to have this lab work done by your health care provider within 7 days. Your INR goal is usually a number between:  2 to 3 or your provider may give you a more narrow range like 2-2.5.  Ask your health care provider during an office visit what your goal INR is.  What  do you need to  know  About  COUMADIN? Take Coumadin (warfarin) exactly as prescribed by your healthcare provider about the same time each day.  DO NOT stop taking without talking to the doctor who prescribed the medication.  Stopping without other blood clot prevention medication to take the place of Coumadin may increase your risk of developing a new clot or stroke.  Get refills before you run out.  What do you do if you miss a dose? If you miss a dose, take it as soon as you remember on the same day then continue your regularly scheduled regimen the next day.  Do not take two  doses of Coumadin at the same time.  Important Safety Information A possible side effect of Coumadin (Warfarin) is an increased risk of bleeding. You should call your healthcare provider right away if you experience any of the following: Bleeding from an injury or your nose that does not stop. Unusual colored urine (red or dark brown) or unusual colored stools (red or black). Unusual bruising for unknown reasons. A serious fall or if you hit your head (even if there is no bleeding).  Some foods or medicines interact with Coumadin (warfarin) and might alter your response to warfarin. To help avoid this: Eat a balanced diet, maintaining a consistent amount of Vitamin K. Notify your provider about major diet changes you plan to make. Avoid alcohol or limit your intake to 1 drink for women and 2 drinks for men per day. (1 drink is 5 oz. wine, 12 oz. beer, or 1.5 oz. liquor.)  Make sure that ANY health care provider who prescribes medication for you knows that you are taking Coumadin (warfarin).  Also make sure the healthcare provider who is monitoring your Coumadin knows when you have started a new medication including herbals and non-prescription products.  Coumadin (Warfarin)  Major Drug Interactions  Increased Warfarin Effect Decreased Warfarin Effect  Alcohol (large quantities) Antibiotics (esp. Septra/Bactrim, Flagyl, Cipro) Amiodarone (Cordarone) Aspirin (ASA) Cimetidine (Tagamet) Megestrol (Megace) NSAIDs (ibuprofen, naproxen, etc.) Piroxicam (Feldene) Propafenone (Rythmol SR) Propranolol (Inderal) Isoniazid (INH) Posaconazole (  Noxafil) Barbiturates (Phenobarbital) Carbamazepine (Tegretol) Chlordiazepoxide (Librium) Cholestyramine (Questran) Griseofulvin Oral Contraceptives Rifampin Sucralfate (Carafate) Vitamin K   Coumadin (Warfarin) Major Herbal Interactions  Increased Warfarin Effect Decreased Warfarin Effect  Garlic Ginseng Ginkgo biloba Coenzyme Q10 Green  tea St. John's wort    Coumadin (Warfarin) FOOD Interactions  Eat a consistent number of servings per week of foods HIGH in Vitamin K (1 serving =  cup)  Collards (cooked, or boiled & drained) Kale (cooked, or boiled & drained) Mustard greens (cooked, or boiled & drained) Parsley *serving size only =  cup Spinach (cooked, or boiled & drained) Swiss chard (cooked, or boiled & drained) Turnip greens (cooked, or boiled & drained)  Eat a consistent number of servings per week of foods MEDIUM-HIGH in Vitamin K (1 serving = 1 cup)  Asparagus (cooked, or boiled & drained) Broccoli (cooked, boiled & drained, or raw & chopped) Brussel sprouts (cooked, or boiled & drained) *serving size only =  cup Lettuce, raw (green leaf, endive, romaine) Spinach, raw Turnip greens, raw & chopped   These websites have more information on Coumadin (warfarin):  FailFactory.se; VeganReport.com.au;

## 2021-02-09 LAB — PROTIME-INR
INR: 1.4 — ABNORMAL HIGH (ref 0.8–1.2)
Prothrombin Time: 16.7 seconds — ABNORMAL HIGH (ref 11.4–15.2)

## 2021-02-09 LAB — BASIC METABOLIC PANEL
Anion gap: 8 (ref 5–15)
BUN: 21 mg/dL — ABNORMAL HIGH (ref 6–20)
CO2: 25 mmol/L (ref 22–32)
Calcium: 8.9 mg/dL (ref 8.9–10.3)
Chloride: 104 mmol/L (ref 98–111)
Creatinine, Ser: 1.1 mg/dL — ABNORMAL HIGH (ref 0.44–1.00)
GFR, Estimated: >60 mL/min (ref >60)
Glucose, Bld: 100 mg/dL — ABNORMAL HIGH (ref 70–99)
Potassium: 4.1 mmol/L (ref 3.5–5.1)
Sodium: 137 mmol/L (ref 135–145)

## 2021-02-09 LAB — CBC
HCT: 34.7 % — ABNORMAL LOW (ref 36.0–46.0)
Hemoglobin: 11.8 g/dL — ABNORMAL LOW (ref 12.0–15.0)
MCH: 28.4 pg (ref 26.0–34.0)
MCHC: 34 g/dL (ref 30.0–36.0)
MCV: 83.4 fL (ref 80.0–100.0)
Platelets: 211 10*3/uL (ref 150–400)
RBC: 4.16 MIL/uL (ref 3.87–5.11)
RDW: 17.3 % — ABNORMAL HIGH (ref 11.5–15.5)
WBC: 6.5 10*3/uL (ref 4.0–10.5)
nRBC: 0 % (ref 0.0–0.2)

## 2021-02-09 LAB — HEPARIN LEVEL (UNFRACTIONATED): Heparin Unfractionated: 0.44 IU/mL (ref 0.30–0.70)

## 2021-02-09 MED ORDER — SACUBITRIL-VALSARTAN 24-26 MG PO TABS
1.0000 | ORAL_TABLET | Freq: Two times a day (BID) | ORAL | Status: DC
  Administered 2021-02-09 – 2021-02-14 (×9): 1 via ORAL
  Filled 2021-02-09 (×11): qty 1

## 2021-02-09 MED ORDER — EMPAGLIFLOZIN 10 MG PO TABS
10.0000 mg | ORAL_TABLET | Freq: Every day | ORAL | Status: DC
  Administered 2021-02-09 – 2021-02-14 (×6): 10 mg via ORAL
  Filled 2021-02-09 (×6): qty 1

## 2021-02-09 MED ORDER — WARFARIN SODIUM 5 MG PO TABS
7.5000 mg | ORAL_TABLET | Freq: Once | ORAL | Status: AC
  Administered 2021-02-09: 7.5 mg via ORAL
  Filled 2021-02-09: qty 1

## 2021-02-09 MED ORDER — CARVEDILOL 3.125 MG PO TABS
3.1250 mg | ORAL_TABLET | Freq: Two times a day (BID) | ORAL | Status: DC
  Administered 2021-02-10 – 2021-02-14 (×4): 3.125 mg via ORAL
  Filled 2021-02-09 (×8): qty 1

## 2021-02-09 NOTE — Progress Notes (Signed)
PROGRESS NOTE  Anna [name]  DOB: 04-01-1996  PCP: Patient, No Pcp Per (Inactive) [id number]  DOA: 02/05/2021  LOS: 4 days  Hospital Day: 5   Chief Complaint  Patient presents with   Shortness of Breath    Brief narrative: Anna [name] is a 25 y.o. female with no significant past medical history who presented to the ED last night with complaint of 4 to 5 days of nonproductive cough, shortness of breath, palpitation.  She had an episode of fever, chills, generalized aches, and sore throat on August 19 after exposure to her stepdad who had COVID though she was not tested.  She recovered from that illness before the current symptoms developed insidiously over the past few days.    In the ED, patient was afebrile, oxygen saturation low 90s, mildly tachypneic, tachycardic to 130s with blood pressure 130/67.   EKG features sinus tachycardia with rate 127.   Chest x-ray showed cardiomegaly and diffuse bilateral airspace opacities concerning for edema.  CTA chest was negative for PE but notable for mild interstitial and alveolar pulmonary edema and small right pleural effusion.   She was given IV Lasix, admitted to hospitalist service Echocardiogram showed EF less than 20% with LV mural thrombus. Cardiology consultation was obtained with Dr. Algie Coffer. See below for details  Subjective: Patient was seen and examined this afternoon.  Not in distress. This morning, patient had low blood pressure in 80s and had tachycardia up to 120s.   I held Lasix, Entresto and Aldactone and give Coreg.   Cardiology follow-up appreciated later.  Coreg dose was reduced, Entresto was resumed.    Assessment/Plan: New-onset CHF  Dilated and nonischemic cardiomyopathy -Presented with 4 to 5 days of progressive shortness of breath, nonproductive cough, palpitation.  Chest x-ray with pulm edema, BNP elevated. -Echocardiogram with EF less than 20%  -On 9/8, patient underwent cardiac cath, normal  coronaries. -Currently getting medical management.  Cardiology titrating her medications.  Currently on Coreg, Entresto.  Adequately diuresed with Lasix IV.  LV mural thrombus -LV mural thrombus noted in echocardiogram. -Patient was started on heparin drip.  Coumadin has been initiated as well.  INR 1.4 today.  Per cardiology, target INR 2.5-3.5. DOAC not indicated. Recent Labs  Lab 02/07/21 1657 02/08/21 0346 02/09/21 0400  INR 1.0 1.1 1.4*     No medical insurance -Patient has no medical insurance.  Based on her severe CHF, Medicaid application was started.  For now, [place] to help her with coupons for essential medicines.  Mobility: Encourage ambulation Code Status:   Code Status: Full Code  Nutritional status: Body mass index is 39.63 kg/m.     Diet:  Diet Order             Diet Heart Room service appropriate? Yes; Fluid consistency: Thin  Diet effective now                  DVT prophylaxis: Heparin drip   Antimicrobials: None Fluid: None Consultants: Cardiology Family Communication: None at bedside  Status is: Inpatient  Remains inpatient appropriate because: Continues to require inpatient monitoring, on heparin drip until INR is between 2.5-3.5  Dispo: The patient is from: Home              Anticipated d/c is to: Home in 1 to 2 days              Patient currently is not medically stable to d/c.   Difficult to place patient No  Infusions:   sodium chloride     heparin 1,050 Units/hr (02/08/21 1929)    Scheduled Meds:  carvedilol  3.125 mg Oral BID WC   empagliflozin  10 mg Oral Daily   sacubitril-valsartan  1 tablet Oral BID   sodium chloride flush  3 mL Intravenous Q12H   Warfarin - Pharmacist Dosing Inpatient   Does not apply q1600    Antimicrobials: Anti-infectives (From admission, onward)    None       PRN meds: sodium chloride, acetaminophen, ondansetron (ZOFRAN) IV, sodium chloride flush   Objective: Vitals:   02/09/21 0857  02/09/21 1316  BP: 102/73 (!) 91/59  Pulse: (!) 132 (!) 104  Resp:  18  Temp:  98.4 F (36.9 C)  SpO2:  98%   No intake or output data in the 24 hours ending 02/09/21 1533 Filed Weights   02/05/21 0219 02/07/21 0500 02/09/21 0455  Weight: 88.9 kg 88.4 kg 89 kg   Weight change:  Body mass index is 39.63 kg/m.   Physical Exam: General exam: Pleasant young African-American female.  Not in physical distress Skin: No rashes, lesions or ulcers. HEENT: Atraumatic, normocephalic, no obvious bleeding Lungs: Clear to auscultation bilaterally CVS: Regular rate and rhythm, no murmur GI/Abd soft, nontender, nondistended, bowel sound present CNS: Alert, awake, oriented x3 Psychiatry: Mood appropriate Extremities: No pedal edema, no calf tenderness  Data Review: I have personally reviewed the laboratory data and studies available.  Recent Labs  Lab 02/05/21 0219 02/06/21 0403 02/07/21 0351 02/07/21 1058 02/07/21 1139 02/08/21 0346 02/09/21 0400  WBC 7.4 6.8 6.6  --   --  5.9 6.5  NEUTROABS 3.8  --   --   --   --   --   --   HGB 11.1* 12.0 12.7 12.6 12.2 11.6* 11.8*  HCT 33.4* 36.3 36.8 37.0 36.0 34.1* 34.7*  MCV 85.0 83.6 82.5  --   --  83.4 83.4  PLT 244 232 245  --   --  208 211   Recent Labs  Lab 02/05/21 0219 02/05/21 0314 02/06/21 0403 02/07/21 0351 02/07/21 1058 02/07/21 1139 02/08/21 0346 02/09/21 0400  NA 137  --  135 137 140 140 137 137  K 3.7  --  3.8 3.8 4.0 4.0 4.2 4.1  CL 109  --  101 102  --   --  105 104  CO2 21*  --  22 24  --   --  23 25  GLUCOSE 113*  --  91 96  --   --  89 100*  BUN 10  --  15 26*  --   --  19 21*  CREATININE 1.12*  --  1.08* 1.11*  --   --  0.95 1.10*  CALCIUM 8.8*  --  9.1 9.2  --   --  8.5* 8.9  MG  --  1.8 2.3  --   --   --   --   --     F/u labs ordered Unresulted Labs (From admission, onward)     Start     Ordered   02/08/21 0500  Protime-INR  Daily,   R      02/07/21 1630   02/07/21 0500  Heparin level  (unfractionated)  Daily,   R      02/06/21 2041   02/06/21 0500  Basic metabolic panel  Daily,   R      02/05/21 0614   02/06/21 0500  CBC  Daily,  R      02/05/21 1324            Signed, Lorin Glass, MD [facility] 02/09/2021

## 2021-02-09 NOTE — Progress Notes (Signed)
ANTICOAGULATION CONSULT NOTE - Follow Up Consult  Pharmacy Consult for IV Heparin/warfarin Indication:  LV thrombus  No Known Allergies  Patient Measurements: Height: 4\' 11"  (149.9 cm) Weight: 89 kg (196 lb 3.4 oz) IBW/kg (Calculated) : 43.2 Heparin Dosing Weight: 64.5 kg  Vital Signs: Temp: 98.3 F (36.8 C) (09/10 0452) BP: 102/73 (09/10 0857) Pulse Rate: 132 (09/10 0857)  Labs: Recent Labs    02/07/21 0351 02/07/21 1058 02/07/21 1139 02/07/21 1657 02/08/21 0346 02/09/21 0400  HGB 12.7   < > 12.2  --  11.6* 11.8*  HCT 36.8   < > 36.0  --  34.1* 34.7*  PLT 245  --   --   --  208 211  LABPROT  --   --   --  12.9 14.0 16.7*  INR  --   --   --  1.0 1.1 1.4*  HEPARINUNFRC 0.65  --   --   --  0.50 0.44  CREATININE 1.11*  --   --   --  0.95 1.10*   < > = values in this interval not displayed.     Estimated Creatinine Clearance: 75.9 mL/min (A) (by C-G formula based on SCr of 1.1 mg/dL (H)).   Medical History: Past Medical History:  Diagnosis Date   Medical history non-contributory     Medications:  Scheduled:   carvedilol  6.25 mg Oral BID WC   sodium chloride flush  3 mL Intravenous Q12H   Warfarin - Pharmacist Dosing Inpatient   Does not apply q1600   Infusions:   sodium chloride     heparin 1,050 Units/hr (02/08/21 1929)   PRN: sodium chloride, acetaminophen, ondansetron (ZOFRAN) IV, sodium chloride flush  Assessment: 25 yo female presented to ED with cough and shortness of breath found to have new onset HFrEF and now LV thrombus to start IV heparin. Baseline labs obtained  Today, 02/09/2021: Now s/p cardiac cath; no ischemia found Heparin level continues to be therapeutic on current IV heparin rate of 1050 units/hr INR SUBtherapeutic as expected after 7.5mg  warfarin x 2 doses but is responding CBC stable Per RN, no bleeding or infusion issues; sheath removal site intact  Goal of Therapy:  Heparin level 0.3-0.7 units/ml INR 2-3 (unsure why cards  would want goal 2.5-3.5 given pt's other hx) Monitor platelets by anticoagulation protocol: Yes   Plan:  Today will be Day 3 of overlap of heparin/warfarin Continue IV heparin at current rate of 1050 units/hr Repeat warfarin 7.5 mg PO today x 1 Daily CBC, heparin level, and INR F/u plans for discharge; can use LMWH to bridge warfarin to facilitate discharging pt home while INR remains subtherapeutic Plan warfarin education prior to discharge   04/11/2021, PharmD, BCPS Secure Chat if ?s 02/09/2021 9:46 AM

## 2021-02-09 NOTE — Progress Notes (Signed)
Subjective:  Patient denies any chest pain or shortness of breath but states breathing treatments little labored.  Lasix Aldactone and Entresto was discontinued earlier this morning due to low blood pressure.  Heart rate improved after receiving carvedilol  Objective:  Vital Signs in the last 24 hours: Temp:  [97.7 F (36.5 C)-98.4 F (36.9 C)] 98.3 F (36.8 C) (09/10 0452) Pulse Rate:  [92-133] 132 (09/10 0857) Resp:  [22] 22 (09/10 0452) BP: (79-118)/(49-74) 102/73 (09/10 0857) SpO2:  [98 %-99 %] 98 % (09/10 0554) Weight:  [89 kg] 89 kg (09/10 0455)  Intake/Output from previous day: 09/09 0701 - 09/10 0700 In: 240 [P.O.:240] Out: -  Intake/Output from this shift: No intake/output data recorded.  Physical Exam: Neck: no adenopathy, no carotid bruit, no JVD, and supple, symmetrical, trachea midline Lungs: clear to auscultation bilaterally Heart: regular rate and rhythm, S1, S2 normal, and 2/6 systolic murmur noted Abdomen: soft, non-tender; bowel sounds normal; no masses,  no organomegaly Extremities: extremities normal, atraumatic, no cyanosis or edema and right groin stable  Lab Results: Recent Labs    02/08/21 0346 02/09/21 0400  WBC 5.9 6.5  HGB 11.6* 11.8*  PLT 208 211   Recent Labs    02/08/21 0346 02/09/21 0400  NA 137 137  K 4.2 4.1  CL 105 104  CO2 23 25  GLUCOSE 89 100*  BUN 19 21*  CREATININE 0.95 1.10*   No results for input(s): TROPONINI in the last 72 hours.  Invalid input(s): CK, MB Hepatic Function Panel No results for input(s): PROT, ALBUMIN, AST, ALT, ALKPHOS, BILITOT, BILIDIR, IBILI in the last 72 hours. No results for input(s): CHOL in the last 72 hours. No results for input(s): PROTIME in the last 72 hours.  Imaging: No results found.  Cardiac Studies:  Assessment/Plan:  Acute systolic left heart failure, HFrEF Dilated and non-ischemic cardiomyopathy LV apical clot Obesity Alcohol use disorder Marijuana use  disorder Plan Reduce carvedilol to 3.125 mg twice daily for now Restart Entresto 24/26 mg 1 tablet twice daily Add SGLT 2 inhibitor as per orders Will restart Aldactone and increase carvedilol as blood pressure tolerates, will hold off digoxin in view of LV apical thrombus Patient advised to refrain from drinking alcohol  LOS: 4 days    Rinaldo Cloud 02/09/2021, 12:14 PM

## 2021-02-10 LAB — HEPARIN LEVEL (UNFRACTIONATED): Heparin Unfractionated: 0.68 IU/mL (ref 0.30–0.70)

## 2021-02-10 LAB — PROTIME-INR
INR: 1.6 — ABNORMAL HIGH (ref 0.8–1.2)
Prothrombin Time: 18.7 seconds — ABNORMAL HIGH (ref 11.4–15.2)

## 2021-02-10 LAB — CBC
HCT: 37.9 % (ref 36.0–46.0)
Hemoglobin: 12.9 g/dL (ref 12.0–15.0)
MCH: 28.5 pg (ref 26.0–34.0)
MCHC: 34 g/dL (ref 30.0–36.0)
MCV: 83.7 fL (ref 80.0–100.0)
Platelets: 206 10*3/uL (ref 150–400)
RBC: 4.53 MIL/uL (ref 3.87–5.11)
RDW: 17.6 % — ABNORMAL HIGH (ref 11.5–15.5)
WBC: 6.6 10*3/uL (ref 4.0–10.5)
nRBC: 0 % (ref 0.0–0.2)

## 2021-02-10 LAB — BASIC METABOLIC PANEL
Anion gap: 12 (ref 5–15)
BUN: 17 mg/dL (ref 6–20)
CO2: 21 mmol/L — ABNORMAL LOW (ref 22–32)
Calcium: 9.2 mg/dL (ref 8.9–10.3)
Chloride: 103 mmol/L (ref 98–111)
Creatinine, Ser: 1.11 mg/dL — ABNORMAL HIGH (ref 0.44–1.00)
GFR, Estimated: >60 mL/min (ref >60)
Glucose, Bld: 85 mg/dL (ref 70–99)
Potassium: 4.3 mmol/L (ref 3.5–5.1)
Sodium: 136 mmol/L (ref 135–145)

## 2021-02-10 MED ORDER — WARFARIN SODIUM 5 MG PO TABS
7.5000 mg | ORAL_TABLET | Freq: Once | ORAL | Status: AC
  Administered 2021-02-10: 7.5 mg via ORAL
  Filled 2021-02-10: qty 1

## 2021-02-10 NOTE — Progress Notes (Signed)
PROGRESS NOTE  Anna [name]  DOB: [birth year]  PCP: Patient, No Pcp Per (Inactive) ZOX:[id number]  DOA: 02/05/2021  LOS: 5 days  Hospital Day: 6   Chief Complaint  Patient presents with   Shortness of Breath    Brief narrative: Anna [name] is a 25 y.o. female with no significant past medical history who presented to the ED last night with complaint of 4 to 5 days of nonproductive cough, shortness of breath, palpitation.  She had an episode of fever, chills, generalized aches, and sore throat on August 19 after exposure to her stepdad who had COVID though she was not tested.  She recovered from that illness before the current symptoms developed insidiously over the past few days.   Initial work-up showed pulmonary edema Patient was admitted to hospitalist service. Echocardiogram showed EF less than 20% with LV mural thrombus Cardiology consultation was obtained.  Patient underwent cardiac cath.  Normal coronaries identified. Currently getting CHF regimen optimized and also remains on heparin drip and Coumadin target off INR 2.5-3.5.  Subjective: Patient was seen and examined this morning.  Lying on bed.  Not in distress.  No new symptoms.  No lightheadedness, dizziness on walking to the bathroom.  Mom at bedside. I encouraged the patient to ambulate on the hallway and reviewed her symptoms.  Assessment/Plan: New-onset CHF  Dilated and nonischemic cardiomyopathy -Presented with 4 to 5 days of progressive shortness of breath, nonproductive cough, palpitation.  Chest x-ray with pulm edema, BNP elevated. -Echocardiogram with EF less than 20%  -On 9/8, patient underwent cardiac cath, normal coronaries. -Currently getting medical management.  Blood pressure soft.  Currently on Coreg and Entresto.  Lasix and Aldactone on hold.  Euvolemic.  LV mural thrombus -LV mural thrombus noted in echocardiogram. -Patient was started on heparin drip.  Coumadin has been initiated as well.  INR 1.6  today.  Per cardiology, target INR 2.5-3.5. DOAC not indicated for LV thrombus. Recent Labs  Lab 02/07/21 1657 02/08/21 0346 02/09/21 0400 02/10/21 0422  INR 1.0 1.1 1.4* 1.6*    No medical insurance -Patient has no medical insurance.  She applied for Medicaid.  For now, TOC to help her with coupons for essential medicines.  Mobility: Encourage ambulation Code Status:   Code Status: Full Code  Nutritional status: Body mass index is 39.54 kg/m.     Diet:  Diet Order             Diet Heart Room service appropriate? Yes; Fluid consistency: Thin  Diet effective now                  DVT prophylaxis: Heparin drip warfarin (COUMADIN) tablet 7.5 mg   Antimicrobials: None Fluid: None Consultants: Cardiology Family Communication: None at bedside  Status is: Inpatient  Remains inpatient appropriate because: Continues to require inpatient monitoring, on heparin drip until INR is between 2.5-3.5  Dispo: The patient is from: Home              Anticipated d/c is to: Home in 1 to 2 days              Patient currently is not medically stable to d/c.   Difficult to place patient No     Infusions:   sodium chloride     heparin 1,050 Units/hr (02/09/21 1808)    Scheduled Meds:  carvedilol  3.125 mg Oral BID WC   empagliflozin  10 mg Oral Daily   sacubitril-valsartan  1 tablet Oral BID  sodium chloride flush  3 mL Intravenous Q12H   warfarin  7.5 mg Oral ONCE-1600   Warfarin - Pharmacist Dosing Inpatient   Does not apply q1600    Antimicrobials: Anti-infectives (From admission, onward)    None       PRN meds: sodium chloride, acetaminophen, ondansetron (ZOFRAN) IV, sodium chloride flush   Objective: Vitals:   02/10/21 0836 02/10/21 1019  BP: 94/64 92/63  Pulse: (!) 103 (!) 108  Resp: 16   Temp: 98.1 F (36.7 C)   SpO2: 97%     Intake/Output Summary (Last 24 hours) at 02/10/2021 1032 Last data filed at 02/09/2021 2020 Gross per 24 hour  Intake 240 ml   Output 600 ml  Net -360 ml   Filed Weights   02/07/21 0500 02/09/21 0455 02/10/21 0500  Weight: 88.4 kg 89 kg 88.8 kg   Weight change: -0.2 kg Body mass index is 39.54 kg/m.   Physical Exam: General exam: Pleasant young African-American female.  Not in physical distress Skin: No rashes, lesions or ulcers. HEENT: Atraumatic, normocephalic, no obvious bleeding Lungs: Clear to auscultation bilaterally CVS: Regular rate and rhythm, no murmur GI/Abd soft, nontender, nondistended, bowel sound present CNS: Alert, awake, oriented x3 Psychiatry: Mood appropriate Extremities: No pedal edema, no calf tenderness  Data Review: I have personally reviewed the laboratory data and studies available.  Recent Labs  Lab 02/05/21 0219 02/06/21 0403 02/07/21 0351 02/07/21 1058 02/07/21 1139 02/08/21 0346 02/09/21 0400 02/10/21 0422  WBC 7.4 6.8 6.6  --   --  5.9 6.5 6.6  NEUTROABS 3.8  --   --   --   --   --   --   --   HGB 11.1* 12.0 12.7 12.6 12.2 11.6* 11.8* 12.9  HCT 33.4* 36.3 36.8 37.0 36.0 34.1* 34.7* 37.9  MCV 85.0 83.6 82.5  --   --  83.4 83.4 83.7  PLT 244 232 245  --   --  208 211 206    Recent Labs  Lab 02/05/21 0314 02/06/21 0403 02/07/21 0351 02/07/21 1058 02/07/21 1139 02/08/21 0346 02/09/21 0400 02/10/21 0422  NA  --  135 137 140 140 137 137 136  K  --  3.8 3.8 4.0 4.0 4.2 4.1 4.3  CL  --  101 102  --   --  105 104 103  CO2  --  22 24  --   --  23 25 21*  GLUCOSE  --  91 96  --   --  89 100* 85  BUN  --  15 26*  --   --  19 21* 17  CREATININE  --  1.08* 1.11*  --   --  0.95 1.10* 1.11*  CALCIUM  --  9.1 9.2  --   --  8.5* 8.9 9.2  MG 1.8 2.3  --   --   --   --   --   --      F/u labs ordered Unresulted Labs (From admission, onward)     Start     Ordered   02/08/21 0500  Protime-INR  Daily,   R      02/07/21 1630   02/07/21 0500  Heparin level (unfractionated)  Daily,   R      02/06/21 2041            Signed, Lorin Glass, MD [facility] 02/10/2021

## 2021-02-10 NOTE — Progress Notes (Signed)
ANTICOAGULATION CONSULT NOTE - Follow Up Consult  Pharmacy Consult for IV Heparin/warfarin Indication:  LV thrombus  No Known Allergies  Patient Measurements: Height: 4\' 11"  (149.9 cm) Weight: 88.8 kg (195 lb 12.3 oz) IBW/kg (Calculated) : 43.2 Heparin Dosing Weight: 64.5 kg  Vital Signs: Temp: 98.1 F (36.7 C) (09/11 0836) Temp Source: Oral (09/11 0836) BP: 94/64 (09/11 0836) Pulse Rate: 103 (09/11 0836)  Labs: Recent Labs    02/08/21 0346 02/09/21 0400 02/10/21 0422  HGB 11.6* 11.8* 12.9  HCT 34.1* 34.7* 37.9  PLT 208 211 206  LABPROT 14.0 16.7* 18.7*  INR 1.1 1.4* 1.6*  HEPARINUNFRC 0.50 0.44 0.68  CREATININE 0.95 1.10* 1.11*     Estimated Creatinine Clearance: 75.1 mL/min (A) (by C-G formula based on SCr of 1.11 mg/dL (H)).   Medical History: Past Medical History:  Diagnosis Date   Medical history non-contributory     Medications:  Scheduled:   carvedilol  3.125 mg Oral BID WC   empagliflozin  10 mg Oral Daily   sacubitril-valsartan  1 tablet Oral BID   sodium chloride flush  3 mL Intravenous Q12H   Warfarin - Pharmacist Dosing Inpatient   Does not apply q1600   Infusions:   sodium chloride     heparin 1,050 Units/hr (02/09/21 1808)   PRN: sodium chloride, acetaminophen, ondansetron (ZOFRAN) IV, sodium chloride flush  Assessment: 25 yo female presented to ED with cough and shortness of breath found to have new onset HFrEF and now LV thrombus to start IV heparin. Baseline labs obtained  Today, 02/10/2021: Now s/p cardiac cath; no ischemia found Heparin level continues to be therapeutic on current IV heparin rate of 1050 units/hr INR SUBtherapeutic after 7.5mg  warfarin x 3 doses but is responding steadily CBC stable Per RN, no bleeding or infusion issues; sheath removal site intact  Goal of Therapy:  Heparin level 0.3-0.7 units/ml INR 2-3 (unsure why cards would want goal 2.5-3.5 given pt's other hx) Monitor platelets by anticoagulation  protocol: Yes   Plan:  Today will be Day 4 of overlap of heparin/warfarin Continue IV heparin at current rate of 1050 units/hr Repeat warfarin 7.5 mg PO today x 1 Daily CBC, heparin level, and INR F/u plans for discharge; can use LMWH to bridge warfarin to facilitate discharging pt home while INR remains subtherapeutic   04/12/2021, PharmD, BCPS Secure Chat if ?s 02/10/2021 9:52 AM

## 2021-02-10 NOTE — Plan of Care (Signed)
  Problem: Education: Goal: Knowledge of General Education information will improve Description: Including pain rating scale, medication(s)/side effects and non-pharmacologic comfort measures Outcome: Progressing   Problem: Clinical Measurements: Goal: Ability to maintain clinical measurements within normal limits will improve Outcome: Progressing Goal: Will remain free from infection Outcome: Progressing Goal: Diagnostic test results will improve Outcome: Progressing Goal: Respiratory complications will improve Outcome: Progressing Goal: Cardiovascular complication will be avoided Outcome: Progressing   Problem: Safety: Goal: Ability to remain free from injury will improve Outcome: Progressing   Problem: Skin Integrity: Goal: Risk for impaired skin integrity will decrease Outcome: Progressing   

## 2021-02-10 NOTE — Progress Notes (Signed)
Subjective:  Patient denies any chest pain or shortness of breath.  Overall feels better.  Denies any dizziness lightheadedness.  Blood pressure remains soft.  Few beats of nonsustained VT noted asymptomatic.    Objective:  Vital Signs in the last 24 hours: Temp:  [97.6 F (36.4 C)-98.4 F (36.9 C)] 98.1 F (36.7 C) (09/11 0836) Pulse Rate:  [99-106] 103 (09/11 0836) Resp:  [15-20] 16 (09/11 0836) BP: (81-102)/(50-68) 94/64 (09/11 0836) SpO2:  [97 %-100 %] 97 % (09/11 0836) Weight:  [88.8 kg] 88.8 kg (09/11 0500)  Intake/Output from previous day: 09/10 0701 - 09/11 0700 In: 240 [P.O.:240] Out: 600 [Urine:600] Intake/Output from this shift: No intake/output data recorded.  Physical Exam: Neck: no adenopathy, no carotid bruit, no JVD, and supple, symmetrical, trachea midline Lungs: clear to auscultation bilaterally Heart: regularly irregular rhythm, S1, S2 normal, and 2/6 systolic murmur noted Abdomen: soft, non-tender; bowel sounds normal; no masses,  no organomegaly Extremities: extremities normal, atraumatic, no cyanosis or edema  Lab Results: Recent Labs    02/09/21 0400 02/10/21 0422  WBC 6.5 6.6  HGB 11.8* 12.9  PLT 211 206   Recent Labs    02/09/21 0400 02/10/21 0422  NA 137 136  K 4.1 4.3  CL 104 103  CO2 25 21*  GLUCOSE 100* 85  BUN 21* 17  CREATININE 1.10* 1.11*   No results for input(s): TROPONINI in the last 72 hours.  Invalid input(s): CK, MB Hepatic Function Panel No results for input(s): PROT, ALBUMIN, AST, ALT, ALKPHOS, BILITOT, BILIDIR, IBILI in the last 72 hours. No results for input(s): CHOL in the last 72 hours. No results for input(s): PROTIME in the last 72 hours.  Imaging: No results found.  Cardiac Studies:  Assessment/Plan:  Compensated systolic left heart failure, HFrEF Dilated and non-ischemic cardiomyopathy LV apical clot Obesity Alcohol use disorder Marijuana use disorder Plan Continue present management Will need  repeat 2D echo in 3 months to evaluate LV systolic function may need ICD if EF remains below discussed briefly with patient. Dr. Algie Coffer to follow from a.m.  LOS: 5 days    Rinaldo Cloud 02/10/2021, 9:16 AM

## 2021-02-11 LAB — PROTIME-INR
INR: 1.8 — ABNORMAL HIGH (ref 0.8–1.2)
Prothrombin Time: 21.1 seconds — ABNORMAL HIGH (ref 11.4–15.2)

## 2021-02-11 LAB — HEPARIN LEVEL (UNFRACTIONATED): Heparin Unfractionated: 0.59 IU/mL (ref 0.30–0.70)

## 2021-02-11 LAB — MAGNESIUM: Magnesium: 2.2 mg/dL (ref 1.7–2.4)

## 2021-02-11 MED ORDER — WARFARIN SODIUM 5 MG PO TABS
10.0000 mg | ORAL_TABLET | Freq: Once | ORAL | Status: AC
  Administered 2021-02-11: 10 mg via ORAL
  Filled 2021-02-11: qty 2

## 2021-02-11 NOTE — Progress Notes (Signed)
ANTICOAGULATION CONSULT NOTE - Follow Up Consult  Pharmacy Consult for IV Heparin/warfarin Indication:  LV thrombus  No Known Allergies  Patient Measurements: Height: 4\' 11"  (149.9 cm) Weight: 88.8 kg (195 lb 12.3 oz) IBW/kg (Calculated) : 43.2 Heparin Dosing Weight: 64.5 kg  Vital Signs: Temp: 98 F (36.7 C) (09/12 0527) Temp Source: Oral (09/12 0527) BP: 109/72 (09/12 0831) Pulse Rate: 107 (09/12 0831)  Labs: Recent Labs    02/09/21 0400 02/10/21 0422 02/11/21 0337  HGB 11.8* 12.9  --   HCT 34.7* 37.9  --   PLT 211 206  --   LABPROT 16.7* 18.7* 21.1*  INR 1.4* 1.6* 1.8*  HEPARINUNFRC 0.44 0.68 0.59  CREATININE 1.10* 1.11*  --    Estimated Creatinine Clearance: 75.1 mL/min (A) (by C-G formula based on SCr of 1.11 mg/dL (H)).  Medical History: Past Medical History:  Diagnosis Date   Medical history non-contributory     Medications:  Scheduled:   carvedilol  3.125 mg Oral BID WC   empagliflozin  10 mg Oral Daily   sacubitril-valsartan  1 tablet Oral BID   sodium chloride flush  3 mL Intravenous Q12H   Warfarin - Pharmacist Dosing Inpatient   Does not apply q1600   Infusions:   sodium chloride     heparin 1,050 Units/hr (02/10/21 2105)   PRN: sodium chloride, acetaminophen, ondansetron (ZOFRAN) IV, sodium chloride flush  Assessment: 25 yo female presented to ED with cough and shortness of breath found to have new onset HFrEF and now LV thrombus to start IV heparin. Baseline labs obtained  Today, 02/11/2021: Heparin level continues to be therapeutic on current IV heparin rate of 1050 units/hr INR SUBtherapeutic after 7.5mg  warfarin x 4 doses but is responding steadily CBC from 9/11 stable (no 9/12 CBC drawn) Per RN, no s/sx bleeding Per RN, pt has had consistent PO meal intake.   Goal of Therapy:  Heparin level 0.3-0.7 units/ml INR 2.5-3.5 per Cards Monitor platelets by anticoagulation protocol: Yes   Plan:  Today will be Day 5 of overlap of  heparin/warfarin Continue IV heparin at current rate of 1050 units/hr Increase warfarin 10 mg PO today x 1  Daily CBC, heparin level, and INR F/u plans for expected discharge; can use LMWH to bridge warfarin to facilitate discharging pt home while INR remains subtherapeutic   11/12, PharmD Candidate 02/11/2021 10:01 AM

## 2021-02-11 NOTE — Plan of Care (Signed)
  Problem: Education: Goal: Knowledge of General Education information will improve Description: Including pain rating scale, medication(s)/side effects and non-pharmacologic comfort measures Outcome: Progressing   Problem: Clinical Measurements: Goal: Ability to maintain clinical measurements within normal limits will improve Outcome: Progressing Goal: Will remain free from infection Outcome: Progressing Goal: Diagnostic test results will improve Outcome: Progressing Goal: Respiratory complications will improve Outcome: Progressing Goal: Cardiovascular complication will be avoided Outcome: Progressing   Problem: Safety: Goal: Ability to remain free from injury will improve Outcome: Progressing   Problem: Skin Integrity: Goal: Risk for impaired skin integrity will decrease Outcome: Progressing   

## 2021-02-11 NOTE — Consult Note (Signed)
Ref: Patient, No Pcp Per (Inactive)   Subjective:  Awake. No chest pain. BP soft this AM. Tolerating small dose of carvedilol only. INR is 1.8 She claims to ambulate in hall.  Objective:  Vital Signs in the last 24 hours: Temp:  [98 F (36.7 C)-98.2 F (36.8 C)] 98.2 F (36.8 C) (09/12 1208) Pulse Rate:  [99-117] 117 (09/12 1628) Cardiac Rhythm: Ventricular tachycardia (09/12 0741) Resp:  [14-20] 20 (09/12 1208) BP: (71-109)/(46-72) 95/63 (09/12 1628) SpO2:  [97 %-99 %] 98 % (09/12 1210)  Physical Exam: BP Readings from Last 1 Encounters:  02/11/21 95/63     Wt Readings from Last 1 Encounters:  02/10/21 88.8 kg    Weight change:  Body mass index is 39.54 kg/m. HEENT: [place]/AT, Eyes-Brown, Conjunctiva-Pink, Sclera-Non-icteric Neck: No JVD, No bruit, Trachea midline. Lungs:  Clear, Bilateral. Cardiac:  Regular rhythm, normal S1 and S2, + S3. II/VI systolic murmur. Abdomen:  Soft, non-tender. BS present. Extremities:  No edema present. No cyanosis. No clubbing. CNS: AxOx3, Cranial nerves grossly intact, moves all 4 extremities.  Skin: Warm and dry.   Intake/Output from previous day: No intake/output data recorded.    Lab Results: BMET    Component Value Date/Time   NA 136 02/10/2021 0422   NA 137 02/09/2021 0400   NA 137 02/08/2021 0346   K 4.3 02/10/2021 0422   K 4.1 02/09/2021 0400   K 4.2 02/08/2021 0346   CL 103 02/10/2021 0422   CL 104 02/09/2021 0400   CL 105 02/08/2021 0346   CO2 21 (L) 02/10/2021 0422   CO2 25 02/09/2021 0400   CO2 23 02/08/2021 0346   GLUCOSE 85 02/10/2021 0422   GLUCOSE 100 (H) 02/09/2021 0400   GLUCOSE 89 02/08/2021 0346   BUN 17 02/10/2021 0422   BUN 21 (H) 02/09/2021 0400   BUN 19 02/08/2021 0346   CREATININE 1.11 (H) 02/10/2021 0422   CREATININE 1.10 (H) 02/09/2021 0400   CREATININE 0.95 02/08/2021 0346   CALCIUM 9.2 02/10/2021 0422   CALCIUM 8.9 02/09/2021 0400   CALCIUM 8.5 (L) 02/08/2021 0346   GFRNONAA >60 02/10/2021  0422   GFRNONAA >60 02/09/2021 0400   GFRNONAA >60 02/08/2021 0346   GFRAA >60 03/03/2020 0010   CBC    Component Value Date/Time   WBC 6.6 02/10/2021 0422   RBC 4.53 02/10/2021 0422   HGB 12.9 02/10/2021 0422   HCT 37.9 02/10/2021 0422   PLT 206 02/10/2021 0422   MCV 83.7 02/10/2021 0422   MCH 28.5 02/10/2021 0422   MCHC 34.0 02/10/2021 0422   RDW 17.6 (H) 02/10/2021 0422   LYMPHSABS 2.7 02/05/2021 0219   MONOABS 0.7 02/05/2021 0219   EOSABS 0.1 02/05/2021 0219   BASOSABS 0.0 02/05/2021 0219   HEPATIC Function Panel Recent Labs    03/03/20 0010 02/05/21 0314 02/06/21 0403  PROT 7.5 7.1 7.4   HEMOGLOBIN A1C No components found for: HGA1C,  MPG CARDIAC ENZYMES No results found for: CKTOTAL, CKMB, CKMBINDEX, TROPONINI BNP No results for input(s): PROBNP in the last 8760 hours. TSH Recent Labs    02/05/21 0621  TSH 1.950   CHOLESTEROL No results for input(s): CHOL in the last 8760 hours.  Scheduled Meds:  carvedilol  3.125 mg Oral BID WC   empagliflozin  10 mg Oral Daily   sacubitril-valsartan  1 tablet Oral BID   sodium chloride flush  3 mL Intravenous Q12H   Warfarin - Pharmacist Dosing Inpatient   Does not apply q1600  Continuous Infusions:  sodium chloride     heparin 1,050 Units/hr (02/10/21 2105)   PRN Meds:.sodium chloride, acetaminophen, ondansetron (ZOFRAN) IV, sodium chloride flush  Assessment/Plan:  Acute systolic left heart failure, HFrEF Dilated and non-ischemic cardiomyopathy LV apical clot Obesity Alcohol use disorder Marijuana use disorder  Plan:  Continue current medications. F/U in 1 week. Discussed use of cane or walker if has low bp and dizzy.   LOS: 6 days   Time spent including chart review, lab review, examination, discussion with patient : 30 min   [name]  MD  02/11/2021, 6:02 PM

## 2021-02-11 NOTE — Progress Notes (Signed)
PROGRESS NOTE  Anna [name]  DOB: 1995-06-20  PCP: Patient, No Pcp Per (Inactive) MVH:[id number]  DOA: 02/05/2021  LOS: 6 days  Hospital Day: 7   Chief Complaint  Patient presents with   Shortness of Breath    Brief narrative: Anna [name] is a 25 y.o. female with no significant past medical history who presented to the ED last night with complaint of 4 to 5 days of nonproductive cough, shortness of breath, palpitation.  She had an episode of fever, chills, generalized aches, and sore throat on August 19 after exposure to her stepdad who had COVID though she was not tested.  She recovered from that illness before the current symptoms developed insidiously over the past few days.   Initial work-up showed pulmonary edema Patient was admitted to hospitalist service. Echocardiogram showed EF less than 20% with LV mural thrombus Cardiology consultation was obtained.  Patient underwent cardiac cath.  Normal coronaries identified. Currently getting CHF regimen optimized and also remains on heparin drip and Coumadin target off INR 2.5-3.5.  Subjective: Patient was seen and examined this morning.   Not in distress.  Feels tired.  Earlier this morning and yesterday, she was able to get up and walk to the hallway despite low blood pressure and did not have symptoms. INR 1.8 today.  Assessment/Plan: New-onset CHF  Dilated and nonischemic cardiomyopathy -Presented with 4 to 5 days of progressive shortness of breath, nonproductive cough, palpitation.  Chest x-ray with pulm edema, BNP elevated. -Echocardiogram with EF less than 20%  -On 9/8, patient underwent cardiac cath, normal coronaries. -Currently getting medical management.  Blood pressure soft.  Currently on Coreg, [place].  Lasix and Aldactone on hold.  Remains euvolemic.  LV mural thrombus -LV mural thrombus noted in echocardiogram. -Patient was started on heparin drip.  Coumadin has been initiated as well.  INR 1.8 today.   Per cardiology, target INR 2.5-3.5. DOAC not indicated for LV thrombus. Recent Labs  Lab 02/07/21 1657 02/08/21 0346 02/09/21 0400 02/10/21 0422 02/11/21 0337  INR 1.0 1.1 1.4* 1.6* 1.8*    Episodes of nonsustained V. Tach -Due to cardiomyopathy.  Potassium and magnesium level normal. Recent Labs  Lab 02/05/21 0314 02/06/21 0403 02/07/21 0351 02/07/21 1058 02/07/21 1139 02/08/21 0346 02/09/21 0400 02/10/21 0422 02/11/21 0337  K  --  3.8   < > 4.0 4.0 4.2 4.1 4.3  --   MG 1.8 2.3  --   --   --   --   --   --  2.2   < > = values in this interval not displayed.    No medical insurance -Patient has no medical insurance.  She applied for Medicaid.  For now, TOC to help her with coupons for essential medicines.  Mobility: Encourage ambulation Code Status:   Code Status: Full Code  Nutritional status: Body mass index is 39.54 kg/m.     Diet:  Diet Order             Diet Heart Room service appropriate? Yes; Fluid consistency: Thin  Diet effective now                  DVT prophylaxis: Heparin drip warfarin (COUMADIN) tablet 10 mg   Antimicrobials: None Fluid: None Consultants: Cardiology Family Communication: None at bedside  Status is: Inpatient  Remains inpatient appropriate because: Continues to require inpatient monitoring, on heparin drip until INR is between 2.5-3.5  Dispo: The patient is from: Home  Anticipated d/c is to: Home in 1 to 2 days              Patient currently is not medically stable to d/c.   Difficult to place patient No     Infusions:   sodium chloride     heparin 1,050 Units/hr (02/10/21 2105)    Scheduled Meds:  carvedilol  3.125 mg Oral BID WC   empagliflozin  10 mg Oral Daily   sacubitril-valsartan  1 tablet Oral BID   sodium chloride flush  3 mL Intravenous Q12H   warfarin  10 mg Oral ONCE-1600   Warfarin - Pharmacist Dosing Inpatient   Does not apply q1600    Antimicrobials: Anti-infectives (From  admission, onward)    None       PRN meds: sodium chloride, acetaminophen, ondansetron (ZOFRAN) IV, sodium chloride flush   Objective: Vitals:   02/11/21 1208 02/11/21 1210  BP: (!) 77/56 (!) 71/46  Pulse: 99 (!) 109  Resp: 20   Temp: 98.2 F (36.8 C)   SpO2: 98% 98%   No intake or output data in the 24 hours ending 02/11/21 1227  Filed Weights   02/07/21 0500 02/09/21 0455 02/10/21 0500  Weight: 88.4 kg 89 kg 88.8 kg   Weight change:  Body mass index is 39.54 kg/m.   Physical Exam: General exam: Pleasant young African-American female.  Not in physical distress. Skin: No rashes, lesions or ulcers. HEENT: Atraumatic, normocephalic, no obvious bleeding Lungs: Clear to auscultate bilaterally CVS: Regular rate and rhythm, no murmur GI/Abd soft, nontender, nondistended, bowel sound present CNS: Alert, awake, oriented x3 Psychiatry: Mood appropriate Extremities: No pedal edema, no calf tenderness  Data Review: I have personally reviewed the laboratory data and studies available.  Recent Labs  Lab 02/05/21 0219 02/06/21 0403 02/07/21 0351 02/07/21 1058 02/07/21 1139 02/08/21 0346 02/09/21 0400 02/10/21 0422  WBC 7.4 6.8 6.6  --   --  5.9 6.5 6.6  NEUTROABS 3.8  --   --   --   --   --   --   --   HGB 11.1* 12.0 12.7 12.6 12.2 11.6* 11.8* 12.9  HCT 33.4* 36.3 36.8 37.0 36.0 34.1* 34.7* 37.9  MCV 85.0 83.6 82.5  --   --  83.4 83.4 83.7  PLT 244 232 245  --   --  208 211 206    Recent Labs  Lab 02/05/21 0314 02/06/21 0403 02/07/21 0351 02/07/21 1058 02/07/21 1139 02/08/21 0346 02/09/21 0400 02/10/21 0422 02/11/21 0337  NA  --  135 137 140 140 137 137 136  --   K  --  3.8 3.8 4.0 4.0 4.2 4.1 4.3  --   CL  --  101 102  --   --  105 104 103  --   CO2  --  22 24  --   --  23 25 21*  --   GLUCOSE  --  91 96  --   --  89 100* 85  --   BUN  --  15 26*  --   --  19 21* 17  --   CREATININE  --  1.08* 1.11*  --   --  0.95 1.10* 1.11*  --   CALCIUM  --  9.1  9.2  --   --  8.5* 8.9 9.2  --   MG 1.8 2.3  --   --   --   --   --   --  2.2  F/u labs ordered Unresulted Labs (From admission, onward)     Start     Ordered   02/12/21 0500  Basic metabolic panel  Tomorrow morning,   R        02/11/21 0816   02/12/21 0500  CBC with Differential/Platelet  Daily,   R      02/11/21 1007   02/08/21 0500  Protime-INR  Daily,   R      02/07/21 1630   02/07/21 0500  Heparin level (unfractionated)  Daily,   R      02/06/21 2041            Signed, Lorin Glass, MD [facility] 02/11/2021

## 2021-02-12 ENCOUNTER — Ambulatory Visit: Payer: Medicaid Other | Admitting: Nurse Practitioner

## 2021-02-12 LAB — CBC WITH DIFFERENTIAL/PLATELET
Abs Immature Granulocytes: 0.01 10*3/uL (ref 0.00–0.07)
Basophils Absolute: 0 10*3/uL (ref 0.0–0.1)
Basophils Relative: 1 %
Eosinophils Absolute: 0.1 10*3/uL (ref 0.0–0.5)
Eosinophils Relative: 1 %
HCT: 34.4 % — ABNORMAL LOW (ref 36.0–46.0)
Hemoglobin: 11.6 g/dL — ABNORMAL LOW (ref 12.0–15.0)
Immature Granulocytes: 0 %
Lymphocytes Relative: 44 %
Lymphs Abs: 2.6 10*3/uL (ref 0.7–4.0)
MCH: 27.9 pg (ref 26.0–34.0)
MCHC: 33.7 g/dL (ref 30.0–36.0)
MCV: 82.7 fL (ref 80.0–100.0)
Monocytes Absolute: 0.7 10*3/uL (ref 0.1–1.0)
Monocytes Relative: 11 %
Neutro Abs: 2.5 10*3/uL (ref 1.7–7.7)
Neutrophils Relative %: 43 %
Platelets: 180 10*3/uL (ref 150–400)
RBC: 4.16 MIL/uL (ref 3.87–5.11)
RDW: 17.2 % — ABNORMAL HIGH (ref 11.5–15.5)
WBC: 5.9 10*3/uL (ref 4.0–10.5)
nRBC: 0 % (ref 0.0–0.2)

## 2021-02-12 LAB — BASIC METABOLIC PANEL
Anion gap: 7 (ref 5–15)
BUN: 17 mg/dL (ref 6–20)
CO2: 22 mmol/L (ref 22–32)
Calcium: 8.7 mg/dL — ABNORMAL LOW (ref 8.9–10.3)
Chloride: 103 mmol/L (ref 98–111)
Creatinine, Ser: 0.95 mg/dL (ref 0.44–1.00)
GFR, Estimated: >60 mL/min (ref >60)
Glucose, Bld: 89 mg/dL (ref 70–99)
Potassium: 4.1 mmol/L (ref 3.5–5.1)
Sodium: 132 mmol/L — ABNORMAL LOW (ref 135–145)

## 2021-02-12 LAB — HEPARIN LEVEL (UNFRACTIONATED): Heparin Unfractionated: 0.7 IU/mL (ref 0.30–0.70)

## 2021-02-12 LAB — PROTIME-INR
INR: 2.1 — ABNORMAL HIGH (ref 0.8–1.2)
Prothrombin Time: 23.2 seconds — ABNORMAL HIGH (ref 11.4–15.2)

## 2021-02-12 MED ORDER — HEPARIN (PORCINE) 25000 UT/250ML-% IV SOLN
1000.0000 [IU]/h | INTRAVENOUS | Status: DC
  Filled 2021-02-12: qty 250

## 2021-02-12 MED ORDER — ENOXAPARIN SODIUM 100 MG/ML IJ SOSY
1.0000 mg/kg | PREFILLED_SYRINGE | Freq: Two times a day (BID) | INTRAMUSCULAR | Status: DC
  Administered 2021-02-12 (×2): 85 mg via SUBCUTANEOUS
  Filled 2021-02-12 (×2): qty 1

## 2021-02-12 MED ORDER — SODIUM CHLORIDE 0.9 % IV BOLUS
250.0000 mL | Freq: Once | INTRAVENOUS | Status: AC
  Administered 2021-02-12: 250 mL via INTRAVENOUS

## 2021-02-12 MED ORDER — WARFARIN SODIUM 5 MG PO TABS
10.0000 mg | ORAL_TABLET | Freq: Once | ORAL | Status: AC
  Administered 2021-02-12: 10 mg via ORAL
  Filled 2021-02-12: qty 2

## 2021-02-12 NOTE — Consult Note (Signed)
Ref: Patient, No Pcp Per (Inactive)   Subjective:  Awake. VS stable. She has not ambulated today. INR is now 2.1.  Objective:  Vital Signs in the last 24 hours: Temp:  [97.7 F (36.5 C)-98.2 F (36.8 C)] 97.7 F (36.5 C) (09/13 0900) Pulse Rate:  [94-120] 94 (09/13 0900) Cardiac Rhythm: Sinus tachycardia (09/13 0812) Resp:  [15-23] 16 (09/13 0900) BP: (71-111)/(46-79) 93/65 (09/13 0900) SpO2:  [92 %-100 %] 100 % (09/13 0808) Weight:  [86 kg] 86 kg (09/13 0600)  Physical Exam: BP Readings from Last 1 Encounters:  02/12/21 93/65     Wt Readings from Last 1 Encounters:  02/12/21 86 kg    Weight change:  Body mass index is 38.29 kg/m. HEENT: [place]/AT, Eyes-Brown, Conjunctiva-Pink, Sclera-Non-icteric Neck: No JVD, No bruit, Trachea midline. Lungs:  Clear, Bilateral. Cardiac:  Regular rhythm, normal S1 and S2, no S3. II/VI systolic murmur. Abdomen:  Soft, non-tender. BS present. Extremities:  No edema present. No cyanosis. No clubbing. CNS: AxOx3, Cranial nerves grossly intact, moves all 4 extremities.  Skin: Warm and dry.   Intake/Output from previous day: 09/12 0701 - 09/13 0700 In: 1020.4 [I.V.:766.8; IV Piggyback:253.5] Out: -     Lab Results: BMET    Component Value Date/Time   NA 132 (L) 02/12/2021 0339   NA 136 02/10/2021 0422   NA 137 02/09/2021 0400   K 4.1 02/12/2021 0339   K 4.3 02/10/2021 0422   K 4.1 02/09/2021 0400   CL 103 02/12/2021 0339   CL 103 02/10/2021 0422   CL 104 02/09/2021 0400   CO2 22 02/12/2021 0339   CO2 21 (L) 02/10/2021 0422   CO2 25 02/09/2021 0400   GLUCOSE 89 02/12/2021 0339   GLUCOSE 85 02/10/2021 0422   GLUCOSE 100 (H) 02/09/2021 0400   BUN 17 02/12/2021 0339   BUN 17 02/10/2021 0422   BUN 21 (H) 02/09/2021 0400   CREATININE 0.95 02/12/2021 0339   CREATININE 1.11 (H) 02/10/2021 0422   CREATININE 1.10 (H) 02/09/2021 0400   CALCIUM 8.7 (L) 02/12/2021 0339   CALCIUM 9.2 02/10/2021 0422   CALCIUM 8.9 02/09/2021 0400    GFRNONAA >60 02/12/2021 0339   GFRNONAA >60 02/10/2021 0422   GFRNONAA >60 02/09/2021 0400   GFRAA >60 03/03/2020 0010   CBC    Component Value Date/Time   WBC 5.9 02/12/2021 0339   RBC 4.16 02/12/2021 0339   HGB 11.6 (L) 02/12/2021 0339   HCT 34.4 (L) 02/12/2021 0339   PLT 180 02/12/2021 0339   MCV 82.7 02/12/2021 0339   MCH 27.9 02/12/2021 0339   MCHC 33.7 02/12/2021 0339   RDW 17.2 (H) 02/12/2021 0339   LYMPHSABS 2.6 02/12/2021 0339   MONOABS 0.7 02/12/2021 0339   EOSABS 0.1 02/12/2021 0339   BASOSABS 0.0 02/12/2021 0339   HEPATIC Function Panel Recent Labs    03/03/20 0010 02/05/21 0314 02/06/21 0403  PROT 7.5 7.1 7.4   HEMOGLOBIN A1C No components found for: HGA1C,  MPG CARDIAC ENZYMES No results found for: CKTOTAL, CKMB, CKMBINDEX, TROPONINI BNP No results for input(s): PROBNP in the last 8760 hours. TSH Recent Labs    02/05/21 0621  TSH 1.950   CHOLESTEROL No results for input(s): CHOL in the last 8760 hours.  Scheduled Meds:  carvedilol  3.125 mg Oral BID WC   empagliflozin  10 mg Oral Daily   sacubitril-valsartan  1 tablet Oral BID   sodium chloride flush  3 mL Intravenous Q12H   warfarin  10 mg Oral ONCE-1600   Warfarin - Pharmacist Dosing Inpatient   Does not apply q1600   Continuous Infusions:  sodium chloride     heparin 1,000 Units/hr (02/12/21 0816)   PRN Meds:.sodium chloride, acetaminophen, ondansetron (ZOFRAN) IV, sodium chloride flush  Assessment/Plan:  Acute systolic left heart failure, HFrEF Dilated and non-ischemic cardiomyopathy LV apical clot Obesity Alcohol use disorder Marijuana use disorder  Plan:  Continue Warfarin. May discharge home when INR over 2.1 F/U in 1 week. Increase activity.    LOS: 7 days   Time spent including chart review, lab review, examination, discussion with patient/Nurse : 30 min   [name]  MD  02/12/2021, 11:46 AM

## 2021-02-12 NOTE — Progress Notes (Signed)
PROGRESS NOTE  Anna [name]  DOB: [birth year]  PCP: Patient, No Pcp Per (Inactive) INO:[id number]  DOA: 02/05/2021  LOS: 7 days  Hospital Day: 8   Chief Complaint  Patient presents with   Shortness of Breath    Brief narrative: Anna [name] is a 25 y.o. female with no significant past medical history who presented to the ED last night with complaint of 4 to 5 days of nonproductive cough, shortness of breath, palpitation.  She had an episode of fever, chills, generalized aches, and sore throat on August 19 after exposure to her stepdad who had COVID though she was not tested.  She recovered from that illness before the current symptoms developed insidiously over the past few days.   Initial work-up showed pulmonary edema Patient was admitted to hospitalist service. Echocardiogram showed EF less than 20% with LV mural thrombus Cardiology consultation was obtained.  Patient underwent cardiac cath.  Normal coronaries identified. Currently getting CHF regimen optimized and also remains on heparin drip and Coumadin target off INR 2.5-3.5.  Subjective: Patient was seen and examined this morning.   Lying down in bed.  Not in distress.  Feels tired.  Blood pressure consistently low.  But she has been not symptomatic otherwise except for tiredness. On heparin drip.  INR 2.1.  Assessment/Plan: New-onset CHF  Dilated and nonischemic cardiomyopathy -Presented with 4 to 5 days of progressive shortness of breath, nonproductive cough, palpitation.  Chest x-ray with pulm edema, BNP elevated. -Echocardiogram with EF less than 20%  -On 9/8, patient underwent cardiac cath, normal coronaries. -Currently getting medical management.  Blood pressure running soft..  Currently on Coreg, [place].  Lasix and Aldactone on hold.  Remains euvolemic.  Wonder if midodrine would be helpful to improve blood pressure which could give some room to optimize CHF regimen.  I sent a text to Dr. Algie Coffer.  LV  mural thrombus -LV mural thrombus was noted in echocardiogram. -Patient was started on heparin drip.  Coumadin has been initiated as well.  INR 2.1 today.  Per cardiology, target INR 2.5-3.5. DOAC not indicated for LV thrombus. -Because of the problem with IV access, will switch her from heparin drip to Lovenox subcu full dose today. Recent Labs  Lab 02/08/21 0346 02/09/21 0400 02/10/21 0422 02/11/21 0337 02/12/21 0339  INR 1.1 1.4* 1.6* 1.8* 2.1*    Episodes of nonsustained V. Tach -Due to cardiomyopathy.  Potassium and magnesium level normal. Recent Labs  Lab 02/06/21 0403 02/07/21 0351 02/07/21 1139 02/08/21 0346 02/09/21 0400 02/10/21 0422 02/11/21 0337 02/12/21 0339  K 3.8   < > 4.0 4.2 4.1 4.3  --  4.1  MG 2.3  --   --   --   --   --  2.2  --    < > = values in this interval not displayed.     No medical insurance -Patient has no medical insurance.  She applied for Medicaid.  For now, TOC to help her with coupons for essential medicines.  Mobility: Encourage ambulation Code Status:   Code Status: Full Code  Nutritional status: Body mass index is 38.29 kg/m.     Diet:  Diet Order             Diet Heart Room service appropriate? Yes; Fluid consistency: Thin  Diet effective now                  DVT prophylaxis: Heparin drip   Antimicrobials: None Fluid: None Consultants: Cardiology Family  Communication: None at bedside  Status is: Inpatient  Remains inpatient appropriate because: Continues to require inpatient monitoring, on Lovenox subcu until INR is between 2.5-3.5.  Patient has no insurance and is unable to afford subcu Lovenox post discharge.  Dispo: The patient is from: Home              Anticipated d/c is to: Home in 1 to 2 days              Patient currently is not medically stable to d/c.   Difficult to place patient No     Infusions:   sodium chloride      Scheduled Meds:  carvedilol  3.125 mg Oral BID WC   empagliflozin  10  mg Oral Daily   enoxaparin (LOVENOX) injection  1 mg/kg Subcutaneous Q12H   sacubitril-valsartan  1 tablet Oral BID   sodium chloride flush  3 mL Intravenous Q12H   Warfarin - Pharmacist Dosing Inpatient   Does not apply q1600    Antimicrobials: Anti-infectives (From admission, onward)    None       PRN meds: sodium chloride, acetaminophen, ondansetron (ZOFRAN) IV, sodium chloride flush   Objective: Vitals:   02/12/21 1328 02/12/21 1356  BP: (!) 86/59   Pulse: (!) 108   Resp: 20   Temp: 97.6 F (36.4 C)   SpO2: 100% 98%    Intake/Output Summary (Last 24 hours) at 02/12/2021 1517 Last data filed at 02/12/2021 1413 Gross per 24 hour  Intake 1020.35 ml  Output 900 ml  Net 120.35 ml    Filed Weights   02/09/21 0455 02/10/21 0500 02/12/21 0600  Weight: 89 kg 88.8 kg 86 kg   Weight change:  Body mass index is 38.29 kg/m.   Physical Exam: General exam: Pleasant young African-American female.  Not in physical distress. Skin: No rashes, lesions or ulcers. HEENT: Atraumatic, normocephalic, no obvious bleeding Lungs: Clear to auscultation bilaterally CVS: Regular rate and rhythm, no murmur GI/Abd soft, nontender, nondistended, bowel sound present CNS: Alert, awake, oriented x3 Psychiatry: Mood appropriate Extremities: No pedal edema, no calf tenderness  Data Review: I have personally reviewed the laboratory data and studies available.  Recent Labs  Lab 02/07/21 0351 02/07/21 1058 02/07/21 1139 02/08/21 0346 02/09/21 0400 02/10/21 0422 02/12/21 0339  WBC 6.6  --   --  5.9 6.5 6.6 5.9  NEUTROABS  --   --   --   --   --   --  2.5  HGB 12.7   < > 12.2 11.6* 11.8* 12.9 11.6*  HCT 36.8   < > 36.0 34.1* 34.7* 37.9 34.4*  MCV 82.5  --   --  83.4 83.4 83.7 82.7  PLT 245  --   --  208 211 206 180   < > = values in this interval not displayed.    Recent Labs  Lab 02/06/21 0403 02/07/21 0351 02/07/21 1058 02/07/21 1139 02/08/21 0346 02/09/21 0400  02/10/21 0422 02/11/21 0337 02/12/21 0339  NA 135 137   < > 140 137 137 136  --  132*  K 3.8 3.8   < > 4.0 4.2 4.1 4.3  --  4.1  CL 101 102  --   --  105 104 103  --  103  CO2 22 24  --   --  23 25 21*  --  22  GLUCOSE 91 96  --   --  89 100* 85  --  89  BUN  15 26*  --   --  19 21* 17  --  17  CREATININE 1.08* 1.11*  --   --  0.95 1.10* 1.11*  --  0.95  CALCIUM 9.1 9.2  --   --  8.5* 8.9 9.2  --  8.7*  MG 2.3  --   --   --   --   --   --  2.2  --    < > = values in this interval not displayed.     F/u labs ordered Unresulted Labs (From admission, onward)     Start     Ordered   02/12/21 0500  CBC with Differential/Platelet  Daily,   R      02/11/21 1007   02/08/21 0500  Protime-INR  Daily,   R      02/07/21 1630            Signed, Lorin Glass, MD [facility] 02/12/2021

## 2021-02-12 NOTE — Significant Event (Signed)
Rapid Response Event Note   Reason for Call :  Hypotension in patient with new CHF diagnosis this admission. Bedside RN monitoring vitals related to medication administration. She reports that BP 102/72 when Entresto given, but dropped to 77/50 MAP 58 now.   Initial Focused Assessment:  Reviewed chart.  Patient is alert, oriented, lying in bed watching television. Lung sounds slightly diminished but no crackles heard.  Heart sounds regular S1-S2, no peripheral edema observed. Patient denies pain or shortness of breath. She states she only feels "heavy and tired" with exertion. Observed 2-3 empty food trays in room, nearly full water bottles on table. Unable to determine full amount of intake this shift. Patient has been independently going to bathroom, but no documentation of output. Patient denies difficulty urinating or other adverse symptoms.   Interventions:  Assessment and monitored vitals on portable monitor. Communicated with on call provider who was also independently reviewing chart. Started NS IV bolus per orders. Bedside RN attached patient to telemetry monitor for BP and cardiac rhythm.  Educated patient and staff about strict monitoring of intake and output. Bedside RN placed graduated urine collection device in toilet to monitor output. Reinforced fall safety teaching as well, encouraging patient to not get up without standby assist due to hypotension, IV pole, and other medical devices.  Plan of Care:  Continue close monitoring of vitals, intake, output, weight, cardiac rhythm, and report any changes in data or symptoms. Patient will remain on current level of care in room 1438 at this time.  Event Summary:   MD Notified: Chinita Greenland, NP 0300 by RR nurse, earlier by bedside nurse, Lanora Manis. Call Time: 0235 Arrival Time: 0245 End Time: 0310  Lamona Curl, RN

## 2021-02-12 NOTE — Progress Notes (Signed)
ANTICOAGULATION CONSULT NOTE - Follow Up Consult  Pharmacy Consult for IV Heparin/warfarin Indication:  LV thrombus  No Known Allergies  Patient Measurements: Height: 4\' 11"  (149.9 cm) Weight: 86 kg (189 lb 9.5 oz) IBW/kg (Calculated) : 43.2 Heparin Dosing Weight: 63.6 kg  Vital Signs: Temp: 97.9 F (36.6 C) (09/12 2000) Temp Source: Oral (09/12 2000) BP: 91/68 (09/13 0600) Pulse Rate: 103 (09/13 0600)  Labs: Recent Labs    02/10/21 0422 02/11/21 0337 02/12/21 0339  HGB 12.9  --  11.6*  HCT 37.9  --  34.4*  PLT 206  --  180  LABPROT 18.7* 21.1* 23.2*  INR 1.6* 1.8* 2.1*  HEPARINUNFRC 0.68 0.59 0.70  CREATININE 1.11*  --  0.95    Estimated Creatinine Clearance: 86.2 mL/min (by C-G formula based on SCr of 0.95 mg/dL).  Medical History: Past Medical History:  Diagnosis Date   Medical history non-contributory     Medications:  Scheduled:   carvedilol  3.125 mg Oral BID WC   empagliflozin  10 mg Oral Daily   sacubitril-valsartan  1 tablet Oral BID   sodium chloride flush  3 mL Intravenous Q12H   Warfarin - Pharmacist Dosing Inpatient   Does not apply q1600   Infusions:   sodium chloride     heparin 1,050 Units/hr (02/12/21 0600)   PRN: sodium chloride, acetaminophen, ondansetron (ZOFRAN) IV, sodium chloride flush  Assessment: 25 yo female presented to ED with cough and shortness of breath found to have new onset HFrEF and now LV thrombus to start IV heparin. Baseline labs obtained  Today, 02/12/2021: Heparin level is therapeutic, but at highest end of goal, on current IV heparin rate of 1050 units/hr INR SUBtherapeutic after 10mg  warfarin (5 total warfarin doses) but is responding steadily CBC stable.  Per RN, no s/sx of bleeding Per RN, pt has consistent PO meal intakes.   Goal of Therapy:  Heparin level 0.3-0.7 units/ml INR 2.5-3.5 per Cards Monitor platelets by anticoagulation protocol: Yes   Plan:  Today will be Day 6 of overlap of  heparin/warfarin Decrease IV heparin to 1000 units/hr Repeat warfarin 10 mg PO today x 1  Daily CBC, heparin level, and INR F/u plans for expected discharge; can use LMWH to bridge warfarin to facilitate discharging pt home while INR remains subtherapeutic   02/14/2021, PharmD Candidate 02/12/2021 7:12 AM

## 2021-02-12 NOTE — Progress Notes (Addendum)
Primary Rn notified charge Rn, rapid response Rn and on call hospitalist team member, Garner Nash NP, on the following: when pt is lying down she is hypotensive with SBP 70-80s with MAP 65 or less. sitting and standing SBP 90s- low 100s with MAP greater than 65. Pt has been asymptomatic throughout the shift so far.  Uncertain of accurate urine output. Urine hat placed in pt room. Pt educated on importance of intake and output strict recordings.  Rapid repsonse Rn assess pt at bedside Garner Nash NP ordered small NS bolus, administered via rapid response Rn.   Continuous vital monitor set up at bedside for q1hr. Pt educated on CHF protocol with emphasis on importance of alerting Rn of intake and urine output for accuracy of fluid volume.  Pt educated and encouraged to notify RN of any abnormal s/s.  Pt stated she understood plan of care.   Will continue to monitor and address needs accordingly.

## 2021-02-12 NOTE — Plan of Care (Signed)
  Problem: Education: Goal: Knowledge of General Education information will improve Description: Including pain rating scale, medication(s)/side effects and non-pharmacologic comfort measures Outcome: Progressing   Problem: Clinical Measurements: Goal: Ability to maintain clinical measurements within normal limits will improve Outcome: Progressing Goal: Will remain free from infection Outcome: Progressing Goal: Diagnostic test results will improve Outcome: Progressing Goal: Respiratory complications will improve Outcome: Progressing Goal: Cardiovascular complication will be avoided Outcome: Progressing   Problem: Safety: Goal: Ability to remain free from injury will improve Outcome: Progressing   Problem: Skin Integrity: Goal: Risk for impaired skin integrity will decrease Outcome: Progressing   

## 2021-02-13 DIAGNOSIS — I513 Intracardiac thrombosis, not elsewhere classified: Secondary | ICD-10-CM

## 2021-02-13 DIAGNOSIS — I472 Ventricular tachycardia: Secondary | ICD-10-CM

## 2021-02-13 DIAGNOSIS — I4729 Other ventricular tachycardia: Secondary | ICD-10-CM

## 2021-02-13 DIAGNOSIS — I420 Dilated cardiomyopathy: Secondary | ICD-10-CM | POA: Diagnosis present

## 2021-02-13 LAB — CBC WITH DIFFERENTIAL/PLATELET
Abs Immature Granulocytes: 0.01 10*3/uL (ref 0.00–0.07)
Basophils Absolute: 0 10*3/uL (ref 0.0–0.1)
Basophils Relative: 0 %
Eosinophils Absolute: 0 10*3/uL (ref 0.0–0.5)
Eosinophils Relative: 1 %
HCT: 34.4 % — ABNORMAL LOW (ref 36.0–46.0)
Hemoglobin: 11.6 g/dL — ABNORMAL LOW (ref 12.0–15.0)
Immature Granulocytes: 0 %
Lymphocytes Relative: 47 %
Lymphs Abs: 2.4 10*3/uL (ref 0.7–4.0)
MCH: 28.1 pg (ref 26.0–34.0)
MCHC: 33.7 g/dL (ref 30.0–36.0)
MCV: 83.3 fL (ref 80.0–100.0)
Monocytes Absolute: 0.6 10*3/uL (ref 0.1–1.0)
Monocytes Relative: 11 %
Neutro Abs: 2.1 10*3/uL (ref 1.7–7.7)
Neutrophils Relative %: 41 %
Platelets: 180 10*3/uL (ref 150–400)
RBC: 4.13 MIL/uL (ref 3.87–5.11)
RDW: 16.7 % — ABNORMAL HIGH (ref 11.5–15.5)
WBC: 5.1 10*3/uL (ref 4.0–10.5)
nRBC: 0 % (ref 0.0–0.2)

## 2021-02-13 LAB — BASIC METABOLIC PANEL
Anion gap: 9 (ref 5–15)
BUN: 16 mg/dL (ref 6–20)
CO2: 23 mmol/L (ref 22–32)
Calcium: 9.5 mg/dL (ref 8.9–10.3)
Chloride: 105 mmol/L (ref 98–111)
Creatinine, Ser: 0.99 mg/dL (ref 0.44–1.00)
GFR, Estimated: >60 mL/min (ref >60)
Glucose, Bld: 82 mg/dL (ref 70–99)
Potassium: 4.6 mmol/L (ref 3.5–5.1)
Sodium: 137 mmol/L (ref 135–145)

## 2021-02-13 LAB — PROTIME-INR
INR: 2.5 — ABNORMAL HIGH (ref 0.8–1.2)
Prothrombin Time: 27.3 seconds — ABNORMAL HIGH (ref 11.4–15.2)

## 2021-02-13 LAB — MAGNESIUM: Magnesium: 2.2 mg/dL (ref 1.7–2.4)

## 2021-02-13 MED ORDER — WARFARIN SODIUM 5 MG PO TABS
10.0000 mg | ORAL_TABLET | Freq: Every day | ORAL | Status: AC
  Administered 2021-02-13: 10 mg via ORAL
  Filled 2021-02-13: qty 2

## 2021-02-13 MED ORDER — MIDODRINE HCL 5 MG PO TABS
5.0000 mg | ORAL_TABLET | Freq: Three times a day (TID) | ORAL | Status: DC
  Administered 2021-02-13 – 2021-02-14 (×4): 5 mg via ORAL
  Filled 2021-02-13 (×4): qty 1

## 2021-02-13 NOTE — Progress Notes (Addendum)
ANTICOAGULATION CONSULT NOTE - Follow Up Consult  Pharmacy Consult for enoxaparin/warfarin Indication:  LV thrombus  No Known Allergies  Patient Measurements: Height: 4\' 11"  (149.9 cm) Weight: 86.3 kg (190 lb 4.1 oz) IBW/kg (Calculated) : 43.2 Heparin Dosing Weight: 63.6 kg  Vital Signs: Temp: 98.1 F (36.7 C) (09/14 0618) Temp Source: Oral (09/14 0618) BP: 85/50 (09/14 0618) Pulse Rate: 107 (09/14 0618)  Labs: Recent Labs    02/11/21 0337 02/12/21 0339 02/13/21 0342  HGB  --  11.6* 11.6*  HCT  --  34.4* 34.4*  PLT  --  180 180  LABPROT 21.1* 23.2* 27.3*  INR 1.8* 2.1* 2.5*  HEPARINUNFRC 0.59 0.70  --   CREATININE  --  0.95  --     Estimated Creatinine Clearance: 86.3 mL/min (by C-G formula based on SCr of 0.95 mg/dL).  Medical History: Past Medical History:  Diagnosis Date   Medical history non-contributory     Medications:  Scheduled:   carvedilol  3.125 mg Oral BID WC   empagliflozin  10 mg Oral Daily   enoxaparin (LOVENOX) injection  1 mg/kg Subcutaneous Q12H   sacubitril-valsartan  1 tablet Oral BID   sodium chloride flush  3 mL Intravenous Q12H   Warfarin - Pharmacist Dosing Inpatient   Does not apply q1600   Infusions:   sodium chloride     PRN: sodium chloride, acetaminophen, ondansetron (ZOFRAN) IV, sodium chloride flush  Assessment: 25 yo female presented to ED with cough and shortness of breath found to have new onset HFrEF and LV thrombus started IV heparin on 02/05/21. Baseline labs obtained. Warfarin initiated on 02/07/21. On 9/13, Pt transitioned from IV Heparin to SubQ Lovenox 85mg  q12h.  Today, 02/13/2021: INR therapeutic after 10mg  warfarin (6 total warfarin doses)  CBC stable.  Per RN, no s/sx of bleeding Per RN, pt has had consistent oral meal intake.   Goal of Therapy:  INR 2.5-3.5 per Cards Monitor platelets by anticoagulation protocol: Yes   Plan:  Discontinue Lovenox Continue warfarin 10 mg PO daily. Daily CBC and INR F/u  plans for expected discharge. Recommend discharging on warfarin 10mg  PO once daily and follow up INR check sooner is preferred within 3-7 days.   , PharmD Candidate 02/13/2021 7:12 AM

## 2021-02-13 NOTE — Progress Notes (Signed)
Patient reported that she started her cycle on today. Patient report mild cramping. See MAR for intervention. No new orders at this time. Will continue to monitor.

## 2021-02-13 NOTE — Progress Notes (Signed)
PROGRESS NOTE    Malayjah Otoole  OMV:[id number] DOB: 1996-06-01 DOA: 02/05/2021 PCP: Patient, No Pcp Per (Inactive) (Confirm with patient/family/NH records and if not entered, this HAS to be entered at [facility] point of entry. "No PCP" if truly none.)   Chief Complaint  Patient presents with   Shortness of Breath    Brief Narrative:  Anna [name] is a 25 y.o. female with no significant past medical history who presented to the ED last night with complaint of 4 to 5 days of nonproductive cough, shortness of breath, palpitation.  She had an episode of fever, chills, generalized aches, and sore throat on August 19 after exposure to her stepdad who had COVID though she was not tested.  She recovered from that illness before the current symptoms developed insidiously over the past few days.   Initial work-up showed pulmonary edema Patient was admitted to hospitalist service. Echocardiogram showed EF less than 20% with LV mural thrombus Cardiology consultation was obtained.  Patient underwent cardiac cath.  Normal coronaries identified. Currently getting CHF regimen optimized and also remains on heparin drip and Coumadin target off INR 2.5-3.5.   Assessment & Plan:   Principal Problem:   New onset of congestive heart failure (HCC) Active Problems:   Nonischemic dilated cardiomyopathy (HCC)   LV (left ventricular) mural thrombus   NSVT (nonsustained ventricular tachycardia) (HCC)  #1 new onset systolic heart failure/idiopathic dilated and nonischemic cardiomyopathy -Patient presented with 4 to 5 days of progressive worsening shortness of breath, nonproductive cough, palpitations. -BNP elevated on presentation with chest x-ray findings consistent with pulmonary edema. -CT angiogram chest negative for PE but consistent with pulmonary edema/volume overload. -Cardiac enzymes negative. -2D echo with a EF of < 20%, left ventricular global hypokinesis, left ventricular internal cavity size moderately  to severely dilated, right ventricle systolic function is normal, right ventricular size is normal.  Moderate to severe mitral valve regurgitation.  Aortic valve normal in structure. -Patient underwent cardiac catheterization on 02/07/2021 with EF < 25%, normal left ventricular end-diastolic pressure, normal coronaries. -Patient was on Lasix and Aldactone however on hold due to soft/low blood pressure. -Cardiology recommending medical management. -Continue Entresto, coreg, Jardiance. -Due to low blood pressure patient started on midodrine per cardiology and first dose given this afternoon. -Cardiology following and appreciate input and recommendations.  2.  LV mural thrombus -Left ventricular mural thrombus noted on 2D echo. -Patient placed on heparin bridge with Coumadin. -Goal INR 2.5-3.5. -INR currently at 2.5 and as such heparin/Lovenox discontinued. -DOAC not indicated for LV thrombus. -Outpatient follow-up with cardiology.  3.  Episodes of nonsustained V. tach -Likely secondary to cardiomyopathy. -Potassium at 4.6, magnesium at 2.2. -Continue Coreg. -Per cardiology.  4.  No medical insurance -Patient with no medical insurance and patient applied for Medicaid. -TOC consulted.  5.  Borderline/low blood pressure -Patient started on midodrine per cardiology with first dose given this afternoon.   DVT prophylaxis: Coumadin Code Status: Full Family Communication: Updated patient.  No family at bedside. Disposition:   Status is: Inpatient  Remains inpatient appropriate because:Inpatient level of care appropriate due to severity of illness  Dispo: The patient is from: Home              Anticipated d/c is to: Home              Patient currently is not medically stable to d/c.   Difficult to place patient No       Consultants:  Cardiology: Dr. Algie Coffer 02/06/2021  Procedures:  CT angiogram chest 02/05/2021 Chest x-ray 02/05/2021 2D echo 02/05/2021 Cardiac catheterization  02/07/2021 per Dr. Algie Coffer     Antimicrobials:  None   Subjective: Patient sitting up in chair.  Denies any chest pain.  Denies any significant shortness of breath.  Just complains of fatigue/tiredness.  Denies any lightheadedness or dizziness.  Denies any syncopal episodes.  Systolic blood pressures in the high 80s to low 90s.  Objective: Vitals:   02/13/21 0618 02/13/21 0940 02/13/21 1047 02/13/21 1048  BP: (!) 85/50 (!) 88/61 (!) 80/60 (!) 90/50  Pulse: (!) 107 (!) 105    Resp: 16     Temp: 98.1 F (36.7 C)     TempSrc: Oral     SpO2: 95%     Weight:      Height:        Intake/Output Summary (Last 24 hours) at 02/13/2021 1712 Last data filed at 02/13/2021 0800 Gross per 24 hour  Intake 120 ml  Output 1575 ml  Net -1455 ml   Filed Weights   02/10/21 0500 02/12/21 0600 02/13/21 0500  Weight: 88.8 kg 86 kg 86.3 kg    Examination:  General exam: Appears calm and comfortable  Respiratory system: Clear to auscultation. Respiratory effort normal. Cardiovascular system: S1 & S2 heard, RRR. No JVD, murmurs, rubs, gallops or clicks. No pedal edema. Gastrointestinal system: Abdomen is nondistended, soft and nontender. No organomegaly or masses felt. Normal bowel sounds heard. Central nervous system: Alert and oriented. No focal neurological deficits. Extremities: Symmetric 5 x 5 power. Skin: No rashes, lesions or ulcers Psychiatry: Judgement and insight appear normal. Mood & affect appropriate.     Data Reviewed: I have personally reviewed following labs and imaging studies  CBC: Recent Labs  Lab 02/08/21 0346 02/09/21 0400 02/10/21 0422 02/12/21 0339 02/13/21 0342  WBC 5.9 6.5 6.6 5.9 5.1  NEUTROABS  --   --   --  2.5 2.1  HGB 11.6* 11.8* 12.9 11.6* 11.6*  HCT 34.1* 34.7* 37.9 34.4* 34.4*  MCV 83.4 83.4 83.7 82.7 83.3  PLT 208 211 206 180 180    Basic Metabolic Panel: Recent Labs  Lab 02/08/21 0346 02/09/21 0400 02/10/21 0422 02/11/21 0337  02/12/21 0339 02/13/21 0342  NA 137 137 136  --  132* 137  K 4.2 4.1 4.3  --  4.1 4.6  CL 105 104 103  --  103 105  CO2 23 25 21*  --  22 23  GLUCOSE 89 100* 85  --  89 82  BUN 19 21* 17  --  17 16  CREATININE 0.95 1.10* 1.11*  --  0.95 0.99  CALCIUM 8.5* 8.9 9.2  --  8.7* 9.5  MG  --   --   --  2.2  --  2.2    GFR: Estimated Creatinine Clearance: 82.8 mL/min (by C-G formula based on SCr of 0.99 mg/dL).  Liver Function Tests: No results for input(s): AST, ALT, ALKPHOS, BILITOT, PROT, ALBUMIN in the last 168 hours.  CBG: No results for input(s): GLUCAP in the last 168 hours.   Recent Results (from the past 240 hour(s))  Resp Panel by RT-PCR (Flu A&B, Covid)     Status: None   Collection Time: 02/05/21  2:53 AM  Result Value Ref Range Status   SARS Coronavirus 2 by RT PCR NEGATIVE NEGATIVE Final    Comment: (NOTE) SARS-CoV-2 target nucleic acids are NOT DETECTED.  The SARS-CoV-2 RNA is generally detectable in upper respiratory specimens  during the acute phase of infection. The lowest concentration of SARS-CoV-2 viral copies this assay can detect is 138 copies/mL. A negative result does not preclude SARS-Cov-2 infection and should not be used as the sole basis for treatment or other patient management decisions. A negative result may occur with  improper specimen collection/handling, submission of specimen other than nasopharyngeal swab, presence of viral mutation(s) within the areas targeted by this assay, and inadequate number of viral copies(<138 copies/mL). A negative result must be combined with clinical observations, patient history, and epidemiological information. The expected result is Negative.  Fact Sheet for Patients:  BloggerCourse.com  Fact Sheet for Healthcare Providers:  SeriousBroker.it  This test is no t yet approved or cleared by the [place] FDA and  has been authorized for detection and/or  diagnosis of SARS-CoV-2 by FDA under an Emergency Use Authorization (EUA). This EUA will remain  in effect (meaning this test can be used) for the duration of the COVID-19 declaration under Section 564(b)(1) of the Act, 21 U.S.C.section 360bbb-3(b)(1), unless the authorization is terminated  or revoked sooner.       Influenza A by PCR NEGATIVE NEGATIVE Final   Influenza B by PCR NEGATIVE NEGATIVE Final    Comment: (NOTE) The Xpert Xpress SARS-CoV-2/FLU/RSV plus assay is intended as an aid in the diagnosis of influenza from Nasopharyngeal swab specimens and should not be used as a sole basis for treatment. Nasal washings and aspirates are unacceptable for Xpert Xpress SARS-CoV-2/FLU/RSV testing.  Fact Sheet for Patients: BloggerCourse.com  Fact Sheet for Healthcare Providers: SeriousBroker.it  This test is not yet approved or cleared by the [place] FDA and has been authorized for detection and/or diagnosis of SARS-CoV-2 by FDA under an Emergency Use Authorization (EUA). This EUA will remain in effect (meaning this test can be used) for the duration of the COVID-19 declaration under Section 564(b)(1) of the Act, 21 U.S.C. section 360bbb-3(b)(1), unless the authorization is terminated or revoked.  Performed at [facility], [address] [place], [place] [address]          Radiology Studies: No results found.      Scheduled Meds:  carvedilol  3.125 mg Oral BID WC   empagliflozin  10 mg Oral Daily   midodrine  5 mg Oral TID WC   sacubitril-valsartan  1 tablet Oral BID   sodium chloride flush  3 mL Intravenous Q12H   Warfarin - Pharmacist Dosing Inpatient   Does not apply q1600   Continuous Infusions:  sodium chloride       LOS: 8 days    Time spent: 40 mins    Ramiro Harvest, MD [facility]   To contact the attending provider between 7A-7P or the covering provider  during after hours 7P-7A, please log into the web site [email or web address] and access using universal [place] password for that web site. If you do not have the password, please call the hospital operator.  02/13/2021, 5:12 PM

## 2021-02-13 NOTE — Consult Note (Signed)
Ref: Patient, No Pcp Per (Inactive)   Subjective:  Awake. Eating evening meal. VS stable. INR 2.5. Off IV heparin.  Low BP continues. Now on Midodrine. She had lot of questions, they were answered and understood.  Objective:  Vital Signs in the last 24 hours: Temp:  [97.7 F (36.5 C)-98.1 F (36.7 C)] 98.1 F (36.7 C) (09/14 0618) Pulse Rate:  [97-117] 105 (09/14 0940) Cardiac Rhythm: Normal sinus rhythm (09/14 0701) Resp:  [12-20] 16 (09/14 0618) BP: (80-101)/(50-74) 90/50 (09/14 1048) SpO2:  [94 %-95 %] 95 % (09/14 0618) Weight:  [86.3 kg] 86.3 kg (09/14 0500)  Physical Exam: BP Readings from Last 1 Encounters:  02/13/21 (!) 90/50     Wt Readings from Last 1 Encounters:  02/13/21 86.3 kg    Weight change: 0.3 kg Body mass index is 38.43 kg/m. HEENT: [place]/AT, Eyes-Brown, Conjunctiva-Pink, Sclera-Non-icteric Neck: No JVD, No bruit, Trachea midline. Lungs:  Clear, Bilateral. Cardiac:  Regular rhythm, normal S1 and S2, no S3. II/VI systolic murmur. Abdomen:  Soft, non-tender. BS present. Extremities:  No edema present. No cyanosis. No clubbing. CNS: AxOx3, Cranial nerves grossly intact, moves all 4 extremities.  Skin: Warm and dry.   Intake/Output from previous day: 09/13 0701 - 09/14 0700 In: 120 [P.O.:120] Out: 1575 [Urine:1575]    Lab Results: BMET    Component Value Date/Time   NA 137 02/13/2021 0342   NA 132 (L) 02/12/2021 0339   NA 136 02/10/2021 0422   K 4.6 02/13/2021 0342   K 4.1 02/12/2021 0339   K 4.3 02/10/2021 0422   CL 105 02/13/2021 0342   CL 103 02/12/2021 0339   CL 103 02/10/2021 0422   CO2 23 02/13/2021 0342   CO2 22 02/12/2021 0339   CO2 21 (L) 02/10/2021 0422   GLUCOSE 82 02/13/2021 0342   GLUCOSE 89 02/12/2021 0339   GLUCOSE 85 02/10/2021 0422   BUN 16 02/13/2021 0342   BUN 17 02/12/2021 0339   BUN 17 02/10/2021 0422   CREATININE 0.99 02/13/2021 0342   CREATININE 0.95 02/12/2021 0339   CREATININE 1.11 (H) 02/10/2021 0422    CALCIUM 9.5 02/13/2021 0342   CALCIUM 8.7 (L) 02/12/2021 0339   CALCIUM 9.2 02/10/2021 0422   GFRNONAA >60 02/13/2021 0342   GFRNONAA >60 02/12/2021 0339   GFRNONAA >60 02/10/2021 0422   GFRAA >60 03/03/2020 0010   CBC    Component Value Date/Time   WBC 5.1 02/13/2021 0342   RBC 4.13 02/13/2021 0342   HGB 11.6 (L) 02/13/2021 0342   HCT 34.4 (L) 02/13/2021 0342   PLT 180 02/13/2021 0342   MCV 83.3 02/13/2021 0342   MCH 28.1 02/13/2021 0342   MCHC 33.7 02/13/2021 0342   RDW 16.7 (H) 02/13/2021 0342   LYMPHSABS 2.4 02/13/2021 0342   MONOABS 0.6 02/13/2021 0342   EOSABS 0.0 02/13/2021 0342   BASOSABS 0.0 02/13/2021 0342   HEPATIC Function Panel Recent Labs    03/03/20 0010 02/05/21 0314 02/06/21 0403  PROT 7.5 7.1 7.4   HEMOGLOBIN A1C No components found for: HGA1C,  MPG CARDIAC ENZYMES No results found for: CKTOTAL, CKMB, CKMBINDEX, TROPONINI BNP No results for input(s): PROBNP in the last 8760 hours. TSH Recent Labs    02/05/21 0621  TSH 1.950   CHOLESTEROL No results for input(s): CHOL in the last 8760 hours.  Scheduled Meds:  carvedilol  3.125 mg Oral BID WC   empagliflozin  10 mg Oral Daily   midodrine  5 mg Oral TID  WC   sacubitril-valsartan  1 tablet Oral BID   sodium chloride flush  3 mL Intravenous Q12H   Warfarin - Pharmacist Dosing Inpatient   Does not apply q1600   Continuous Infusions:  sodium chloride     PRN Meds:.sodium chloride, acetaminophen, ondansetron (ZOFRAN) IV, sodium chloride flush  Assessment/Plan:  Acute systolic left heart failure, HFrEF Dilated and non-ischemic cardiomyopathy LV apical clot Obesity Alcohol use disorder Marijuana use disorder  Plan: Midodrine added for low BP. Patient understood, diet, activity and medication use. She also understood dietary restrictions for warfarin use and need for frequent INR check until stable and then once a month as well as reporting bleeding complications including higher flow  during menstruation. Also understood use of rolling walker to continue activity as much as possible in safe manner. Patient understood passive sexual activity with birth control use. Patient also understood heart failure specialist clinic referral and EP referral for ICD if heart function do not improve as expected. Patient to encourage family members to get Heart function checked to r/o familial dilated cardiomyopathy. Also discussed low utility of heart muscle biopsy, referral to tertiary care if condition do not improve.   LOS: 8 days   Time spent including chart review, lab review, examination, discussion with patient/Nurse : 50 min   [name]  MD  02/13/2021, 7:00 PM

## 2021-02-14 LAB — CBC WITH DIFFERENTIAL/PLATELET
Abs Immature Granulocytes: 0.01 10*3/uL (ref 0.00–0.07)
Basophils Absolute: 0 10*3/uL (ref 0.0–0.1)
Basophils Relative: 1 %
Eosinophils Absolute: 0 10*3/uL (ref 0.0–0.5)
Eosinophils Relative: 1 %
HCT: 35 % — ABNORMAL LOW (ref 36.0–46.0)
Hemoglobin: 11.6 g/dL — ABNORMAL LOW (ref 12.0–15.0)
Immature Granulocytes: 0 %
Lymphocytes Relative: 39 %
Lymphs Abs: 2 10*3/uL (ref 0.7–4.0)
MCH: 27.9 pg (ref 26.0–34.0)
MCHC: 33.1 g/dL (ref 30.0–36.0)
MCV: 84.1 fL (ref 80.0–100.0)
Monocytes Absolute: 0.6 10*3/uL (ref 0.1–1.0)
Monocytes Relative: 12 %
Neutro Abs: 2.4 10*3/uL (ref 1.7–7.7)
Neutrophils Relative %: 47 %
Platelets: 188 10*3/uL (ref 150–400)
RBC: 4.16 MIL/uL (ref 3.87–5.11)
RDW: 17 % — ABNORMAL HIGH (ref 11.5–15.5)
WBC: 5.1 10*3/uL (ref 4.0–10.5)
nRBC: 0 % (ref 0.0–0.2)

## 2021-02-14 LAB — BASIC METABOLIC PANEL
Anion gap: 10 (ref 5–15)
BUN: 16 mg/dL (ref 6–20)
CO2: 22 mmol/L (ref 22–32)
Calcium: 9.4 mg/dL (ref 8.9–10.3)
Chloride: 105 mmol/L (ref 98–111)
Creatinine, Ser: 1.1 mg/dL — ABNORMAL HIGH (ref 0.44–1.00)
GFR, Estimated: >60 mL/min (ref >60)
Glucose, Bld: 88 mg/dL (ref 70–99)
Potassium: 4.5 mmol/L (ref 3.5–5.1)
Sodium: 137 mmol/L (ref 135–145)

## 2021-02-14 LAB — PROTIME-INR
INR: 2.8 — ABNORMAL HIGH (ref 0.8–1.2)
Prothrombin Time: 29.2 seconds — ABNORMAL HIGH (ref 11.4–15.2)

## 2021-02-14 LAB — MAGNESIUM: Magnesium: 2.1 mg/dL (ref 1.7–2.4)

## 2021-02-14 MED ORDER — MIDODRINE HCL 5 MG PO TABS: 5.0000 mg | ORAL_TABLET | Freq: Three times a day (TID) | ORAL | 1 refills | Status: DC

## 2021-02-14 MED ORDER — WARFARIN SODIUM 5 MG PO TABS
10.0000 mg | ORAL_TABLET | Freq: Once | ORAL | Status: AC
  Administered 2021-02-14: 10 mg via ORAL
  Filled 2021-02-14: qty 2

## 2021-02-14 MED ORDER — CARVEDILOL 6.25 MG PO TABS
6.2500 mg | ORAL_TABLET | Freq: Two times a day (BID) | ORAL | Status: DC
  Administered 2021-02-14: 6.25 mg via ORAL
  Filled 2021-02-14: qty 1

## 2021-02-14 MED ORDER — CARVEDILOL 6.25 MG PO TABS: 6.2500 mg | ORAL_TABLET | Freq: Two times a day (BID) | ORAL | 1 refills | Status: DC

## 2021-02-14 MED ORDER — SACUBITRIL-VALSARTAN 24-26 MG PO TABS: 1.0000 | ORAL_TABLET | Freq: Two times a day (BID) | ORAL | 1 refills | Status: DC

## 2021-02-14 MED ORDER — WARFARIN SODIUM 10 MG PO TABS: 10.0000 mg | ORAL_TABLET | Freq: Every day | ORAL | 1 refills | Status: DC

## 2021-02-14 NOTE — Progress Notes (Signed)
ANTICOAGULATION CONSULT NOTE - Follow Up Consult  Pharmacy Consult for enoxaparin/warfarin Indication:  LV thrombus  No Known Allergies  Patient Measurements: Height: 4\' 11"  (149.9 cm) Weight: 85.9 kg (189 lb 6 oz) IBW/kg (Calculated) : 43.2 Heparin Dosing Weight: 63.6 kg  Vital Signs: Temp: 97.6 F (36.4 C) (09/15 0512) Temp Source: Oral (09/15 0512) BP: 92/72 (09/15 0512) Pulse Rate: 109 (09/15 0512)  Labs: Recent Labs    02/12/21 0339 02/13/21 0342 02/14/21 0341  HGB 11.6* 11.6* 11.6*  HCT 34.4* 34.4* 35.0*  PLT 180 180 188  LABPROT 23.2* 27.3* 29.2*  INR 2.1* 2.5* 2.8*  HEPARINUNFRC 0.70  --   --   CREATININE 0.95 0.99 1.10*    Estimated Creatinine Clearance: 74.4 mL/min (A) (by C-G formula based on SCr of 1.1 mg/dL (H)).  Medical History: Past Medical History:  Diagnosis Date   Medical history non-contributory     Medications:  Scheduled:   carvedilol  3.125 mg Oral BID WC   empagliflozin  10 mg Oral Daily   midodrine  5 mg Oral TID WC   sacubitril-valsartan  1 tablet Oral BID   sodium chloride flush  3 mL Intravenous Q12H   Warfarin - Pharmacist Dosing Inpatient   Does not apply q1600   Infusions:   sodium chloride     PRN: sodium chloride, acetaminophen, ondansetron (ZOFRAN) IV, sodium chloride flush  Assessment: 25 yo female presented to ED with cough and shortness of breath found to have new onset HFrEF and LV thrombus started IV heparin on 02/05/21. Baseline labs obtained. Warfarin initiated on 02/07/21. On 9/13, Pt transitioned from IV Heparin to SubQ Lovenox 85mg  q12h.  Today, 02/14/2021: INR therapeutic (2.8) after 10mg  warfarin (7 total warfarin doses)  CBC stable.  Per RN, no s/sx of bleeding but has started menstrual cycle Per RN, pt has had consistent oral meal intake.   Goal of Therapy:  INR 2.5-3.5 per Cards Monitor platelets by anticoagulation protocol: Yes   Plan:  Repeat warfarin 10 mg PO today. Daily CBC and INR F/u plans for  expected discharge. Recommend discharging on warfarin 10mg  PO once daily and follow up INR check sooner is preferred within 3-7 days.   , PharmD, BCPS Pharmacy: [phone] 02/14/2021 7:21 AM

## 2021-02-14 NOTE — Consult Note (Signed)
Ref: Patient, No Pcp Per (Inactive)   Subjective:  Awake. Afebrile. Mild tachycardia persist. BP slightly improved.  Objective:  Vital Signs in the last 24 hours: Temp:  [97.6 F (36.4 C)-98.1 F (36.7 C)] 97.6 F (36.4 C) (09/15 0512) Pulse Rate:  [105-114] 105 (09/15 0933) Cardiac Rhythm: Normal sinus rhythm (09/15 0701) Resp:  [17-20] 18 (09/15 0512) BP: (92-103)/(64-74) 103/74 (09/15 0933) SpO2:  [98 %-99 %] 99 % (09/15 0512) Weight:  [85.9 kg] 85.9 kg (09/15 0600)  Physical Exam: BP Readings from Last 1 Encounters:  02/14/21 103/74     Wt Readings from Last 1 Encounters:  02/14/21 85.9 kg    Weight change: -0.4 kg Body mass index is 38.25 kg/m. HEENT: [place]/AT, Eyes-Brown, Conjunctiva-Pink, Sclera-Non-icteric Neck: No JVD, No bruit, Trachea midline. Lungs:  Clear, Bilateral. Cardiac:  Regular rhythm, normal S1 and S2, no S3. II/VI systolic murmur. Abdomen:  Soft, non-tender. BS present. Extremities:  No edema present. No cyanosis. No clubbing. CNS: AxOx3, Cranial nerves grossly intact, moves all 4 extremities.  Skin: Warm and dry.   Intake/Output from previous day: 09/14 0701 - 09/15 0700 In: 170 [P.O.:170] Out: 1100 [Urine:1100]    Lab Results: BMET    Component Value Date/Time   NA 137 02/14/2021 0341   NA 137 02/13/2021 0342   NA 132 (L) 02/12/2021 0339   K 4.5 02/14/2021 0341   K 4.6 02/13/2021 0342   K 4.1 02/12/2021 0339   CL 105 02/14/2021 0341   CL 105 02/13/2021 0342   CL 103 02/12/2021 0339   CO2 22 02/14/2021 0341   CO2 23 02/13/2021 0342   CO2 22 02/12/2021 0339   GLUCOSE 88 02/14/2021 0341   GLUCOSE 82 02/13/2021 0342   GLUCOSE 89 02/12/2021 0339   BUN 16 02/14/2021 0341   BUN 16 02/13/2021 0342   BUN 17 02/12/2021 0339   CREATININE 1.10 (H) 02/14/2021 0341   CREATININE 0.99 02/13/2021 0342   CREATININE 0.95 02/12/2021 0339   CALCIUM 9.4 02/14/2021 0341   CALCIUM 9.5 02/13/2021 0342   CALCIUM 8.7 (L) 02/12/2021 0339   GFRNONAA >60  02/14/2021 0341   GFRNONAA >60 02/13/2021 0342   GFRNONAA >60 02/12/2021 0339   GFRAA >60 03/03/2020 0010   CBC    Component Value Date/Time   WBC 5.1 02/14/2021 0341   RBC 4.16 02/14/2021 0341   HGB 11.6 (L) 02/14/2021 0341   HCT 35.0 (L) 02/14/2021 0341   PLT 188 02/14/2021 0341   MCV 84.1 02/14/2021 0341   MCH 27.9 02/14/2021 0341   MCHC 33.1 02/14/2021 0341   RDW 17.0 (H) 02/14/2021 0341   LYMPHSABS 2.0 02/14/2021 0341   MONOABS 0.6 02/14/2021 0341   EOSABS 0.0 02/14/2021 0341   BASOSABS 0.0 02/14/2021 0341   HEPATIC Function Panel Recent Labs    03/03/20 0010 02/05/21 0314 02/06/21 0403  PROT 7.5 7.1 7.4   HEMOGLOBIN A1C No components found for: HGA1C,  MPG CARDIAC ENZYMES No results found for: CKTOTAL, CKMB, CKMBINDEX, TROPONINI BNP No results for input(s): PROBNP in the last 8760 hours. TSH Recent Labs    02/05/21 0621  TSH 1.950   CHOLESTEROL No results for input(s): CHOL in the last 8760 hours.  Scheduled Meds:  carvedilol  6.25 mg Oral BID WC   empagliflozin  10 mg Oral Daily   midodrine  5 mg Oral TID WC   sacubitril-valsartan  1 tablet Oral BID   sodium chloride flush  3 mL Intravenous Q12H   warfarin  10 mg Oral ONCE-1600   Warfarin - Pharmacist Dosing Inpatient   Does not apply q1600   Continuous Infusions:  sodium chloride     PRN Meds:.sodium chloride, acetaminophen, ondansetron (ZOFRAN) IV, sodium chloride flush  Assessment/Plan:  Acute systolic left heart failure, HFrEF Dilated and non-ischemic cardiomyopathy LV apical clot Obesity Alcohol use disorder Marijuana use disorder  Plan: Increase Beta-blocker Coreg to 6.25 mg. Bid. Discharge home with current INR and warfarin dose. F/U INR on Monday at office.   LOS: 9 days   Time spent including chart review, lab review, examination, discussion with patient/Nurse : 30 min.   [name]  MD  02/14/2021, 11:20 AM

## 2021-02-14 NOTE — TOC Progression Note (Signed)
Transition of Care [facility]) - Progression Note    Patient Details  Name: Anna [name] MRN: [id number] Date of Birth: [birth year]  [facility] of [facility] CM/SW Contact  Geni Bers, RN Phone Number: 02/14/2021, 10:28 AM  Clinical Narrative:    Pt's appointment for [facility] was changed to Tues 9/20 at 3:30 PM. Pt may be able to purchase medications at [facility].    Expected Discharge Plan: Home/Self Care Barriers to Discharge: No Barriers Identified  Expected Discharge Plan and Services Expected Discharge Plan: Home/Self Care       Living arrangements for the past 2 months: Single Family Home                                       Social Determinants of Health (SDOH) Interventions    Readmission Risk Interventions No flowsheet data found.

## 2021-02-14 NOTE — Discharge Summary (Signed)
Physician Discharge Summary  Anna [name] ZOX:[id number] DOB: 1995/07/04 DOA: 02/05/2021  PCP: Patient, No Pcp Per (Inactive)  Admit date: 02/05/2021 Discharge date: 02/14/2021  Time spent: 55 minutes  Recommendations for Outpatient Follow-up:  Follow-up with Dr. Algie Coffer in 4 days on 02/18/2021 for INR check and hospital follow-up. Follow-up at the [facility] patient care center on 02/19/2021 to establish PCP and further management of chronic medical issues.  On follow-up patient will need a basic metabolic profile, magnesium level done to follow-up on electrolytes and renal function.  CBC also need to be obtained to follow-up on H&H.  Patient's blood pressure need to be reassessed and midodrine may be further uptitrated if needed.   Discharge Diagnoses:  Principal Problem:   New onset of congestive heart failure (HCC) Active Problems:   Nonischemic dilated cardiomyopathy (HCC)   LV (left ventricular) mural thrombus   NSVT (nonsustained ventricular tachycardia) (HCC)   Discharge Condition: Stable and improved.  Diet recommendation: Heart healthy  Filed Weights   02/12/21 0600 02/13/21 0500 02/14/21 0600  Weight: 86 kg 86.3 kg 85.9 kg    History of present illness:  HPI Per Dr. Lysle Rubens is a 25 y.o. female with BMI 40 who denies any significant past medical history and now presents emergency department with 4 to 5 days of nonproductive cough, shortness of breath, and palpitations.  She reports some bilateral foot swelling in the past but not recently.  She had never experienced any symptoms previously.  She had an episode of fever, chills, generalized aches, and sore throat on August 19 after exposure to her stepdad who had COVID though she was not tested.  She recovered from that illness before the current symptoms developed insidiously over the past few days.  No recent pregnancy, denied any excessive alcohol use or drug use, and no chest pain.   ED Course: Upon arrival to  the ED, patient is found to be afebrile, saturating low 90s on room air, mildly tachypneic, tachycardic as high as 130s, and with blood pressure 130/67.  EKG features sinus tachycardia with rate 127.  Chest x-ray with cardiomegaly and diffuse bilateral airspace opacities concerning for edema.  CTA chest is negative for PE but notable for mild interstitial and alveolar pulmonary edema and small right pleural effusion.  Chemistry panel with potassium 3.7 and creatinine 1.12.  CBC with slight normocytic anemia.  COVID and influenza PCR negative.  High-sensitivity troponin is normal and BNP 507.  Patient was given 40 mg IV Lasix in the ED.  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 -BNP elevated on presentation with chest x-ray findings consistent with pulmonary edema. -CT angiogram chest negative for PE but consistent with pulmonary edema/volume overload. -Cardiac enzymes negative. -2D echo with a EF of < 20%, left ventricular global hypokinesis, left ventricular internal cavity size moderately to severely dilated, right ventricle systolic function is normal, right ventricular size is normal.  Moderate to severe mitral valve regurgitation.  Aortic valve normal in structure. -Cardiology was consulted and followed the patient throughout the hospitalization. -Patient underwent cardiac catheterization on 02/07/2021 with EF < 25%, normal left ventricular end-diastolic pressure, normal coronaries. -Patient was on Lasix and Aldactone however was discontinued due to soft/low blood pressure.   -Patient started on midodrine per cardiology fellow low blood pressure/soft blood pressure. -Cardiology recommended medical management. -Patient during the hospitalization was  maintained on Entresto, Coreg, Jardiance. -Patient will be discharged on  Entresto and Coreg with close outpatient follow-up with cardiology.  2.  LV mural thrombus -Left ventricular mural thrombus noted on 2D echo. -Patient placed on heparin bridge with Coumadin. -Goal INR 2.5-3.5. -Patient initially placed on heparin and subsequently transition to Lovenox bridge was started on Coumadin and INR was monitored.   -INR was 2.8 on day of discharge.   -Patient was discharged on Coumadin 10 mg daily with outpatient follow-up with cardiology, Dr. Algie Coffer on 02/18/2021 for further management of anticoagulation.  3.  Episodes of nonsustained V. tach -Likely secondary to cardiomyopathy. -Electrolytes were repleted.   -Patient placed on Coreg and dose uptitrated to 6.25 mg twice daily per cardiology recommendations.   -Patient seen and followed by cardiology throughout the hospitalization.   -Close outpatient follow-up with cardiology.   4.  No medical insurance -Patient with no medical insurance and patient applied for Medicaid. -TOC consulted and followed the patient during the hospitalization..  5.  Borderline/low blood pressure -Patient started on midodrine per cardiology with some improvement with low blood pressure.   -Patient be discharged on midodrine 5 mg 3 times daily.   -Outpatient follow-up with PCP and cardiology.      Procedures: CT angiogram chest 02/05/2021 Chest x-ray 02/05/2021 2D echo 02/05/2021 Cardiac catheterization 02/07/2021 per Dr. Algie Coffer  Consultations: Cardiology: Dr. Algie Coffer 02/06/2021  Discharge Exam: Vitals:   02/14/21 0512 02/14/21 0933  BP: 92/72 103/74  Pulse: (!) 109 (!) 105  Resp: 18   Temp: 97.6 F (36.4 C)   SpO2: 99%     General: NAD Cardiovascular: Tachycardia, no murmurs rubs or gallops.  No JVD.  No lower extremity edema. Respiratory: Lungs clear to auscultation bilaterally.  No wheezes, no crackles, no rhonchi.  Discharge Instructions   Discharge Instructions     Diet - low sodium heart healthy   Complete by: As  directed    Increase activity slowly   Complete by: As directed       Allergies as of 02/14/2021   No Known Allergies      Medication List     TAKE these medications    carvedilol 6.25 MG tablet Commonly known as: COREG Take 1 tablet (6.25 mg total) by mouth 2 (two) times daily with a meal.   midodrine 5 MG tablet Commonly known as: PROAMATINE Take 1 tablet (5 mg total) by mouth 3 (three) times daily with meals.   sacubitril-valsartan 24-26 MG Commonly known as: ENTRESTO Take 1 tablet by mouth 2 (two) times daily.   Vicks DayQuil HBP Cold & Flu 325-10 MG Caps Generic drug: Acetaminophen-DM Take 2 capsules by mouth every 6 (six) hours as needed (cold symptoms).   warfarin 10 MG tablet Commonly known as: Coumadin Take 1 tablet (10 mg total) by mouth daily.               Durable Medical Equipment  (From admission, onward)           Start     Ordered   02/13/21 1659  For home use only DME Walker rolling  Once       Question Answer Comment  Walker: With 5 Inch Wheels   Patient needs a walker to treat with the following condition Debility      02/13/21 1658           No Known Allergies  Follow-up Information     [name], MD. Schedule an appointment as soon  as possible for a visit in 4 day(s).   Specialty: Cardiology Why: F/U and INR check. 2-4 PM Contact information: [address] [place] [address] [phone]         [facility] Follow up.   Specialty: Internal Medicine Why: Tuesday 02/19/21 at 3:30 PM. Please keep this appointment or call the office [phone]. Contact information: [address] [place] [address] [phone]                 The results of significant diagnostics from this hospitalization (including imaging, microbiology, ancillary and laboratory) are listed below for reference.    Significant Diagnostic Studies: DG Chest 2 View  Result Date:  02/05/2021 CLINICAL DATA:  Shortness of breath EXAM: CHEST - 2 VIEW COMPARISON:  None. FINDINGS: Cardiomegaly. Diffuse bilateral airspace disease. No effusions. No acute bony abnormality. IMPRESSION: Cardiomegaly with diffuse bilateral airspace disease concerning for edema. Electronically Signed   By: Charlett Nose M.D.   On: 02/05/2021 02:53   CT Angio Chest PE W and/or Wo Contrast  Result Date: 02/05/2021 CLINICAL DATA:  Cough and shortness of breath for the past 4-5 days. Elevated D-dimer. EXAM: CT ANGIOGRAPHY CHEST WITH CONTRAST TECHNIQUE: Multidetector CT imaging of the chest was performed using the standard protocol during bolus administration of intravenous contrast. Multiplanar CT image reconstructions and MIPs were obtained to evaluate the vascular anatomy. CONTRAST:  OMNIPAQUE IOHEXOL 350 MG/ML SOLN COMPARISON:  Chest x-ray from same day. FINDINGS: Cardiovascular: Satisfactory opacification of the pulmonary arteries to the segmental level. No evidence of pulmonary embolism. Cardiomegaly. No pericardial effusion. No thoracic aortic aneurysm. Mediastinum/Nodes: No enlarged mediastinal, hilar, or axillary lymph nodes. Thyroid gland, trachea, and esophagus demonstrate no significant findings. Lungs/Pleura: Scattered smooth interlobular septal thickening. Patchy irregular ground-glass densities in both upper lobes, and to a lesser extent in the right lower lobe. Small right and trace left pleural effusions. Small amount of fluid along the left major fissure. No pneumothorax. Upper Abdomen: No acute abnormality. Musculoskeletal: No chest wall abnormality. No acute or significant osseous findings. Review of the MIP images confirms the above findings. IMPRESSION: 1. No evidence of pulmonary embolism. 2. Congestive heart failure. Mild interstitial and alveolar pulmonary edema with small right and trace left pleural effusions. Electronically Signed   By: Obie Dredge M.D.   On: 02/05/2021 05:20   CARDIAC  CATHETERIZATION  Result Date: 02/07/2021   LV end diastolic pressure is normal.   LV end diastolic pressure is normal.   The left ventricular ejection fraction is less than 25% by visual estimate. Continue medications for CHF except decrease diuretics use.   ECHOCARDIOGRAM COMPLETE  Result Date: 02/05/2021    ECHOCARDIOGRAM REPORT   Patient Name:   Anna [name] Date of Exam: 02/05/2021 Medical Rec #:  [id number]     Height:       59.0 in Accession #:    [id number]    Weight:       196.0 lb Date of Birth:  [birth year]      BSA:          1.829 m Patient Age:    25 years      BP:           113/67 mmHg Patient Gender: F             HR:           121 bpm. Exam Location:  Inpatient Procedure: 2D Echo, 3D Echo,  Cardiac Doppler, Color Doppler and Intracardiac            Opacification Agent Indications:    R06.02 SOB; I51.7 Cardiomegaly  History:        Patient has no prior history of Echocardiogram examinations.                 Cardiomegaly, Abnormal ECG; Signs/Symptoms:Dyspnea and Shortness                 of Breath.  Sonographer:    Sheralyn Boatman RDCS Referring Phys: [id number] TIMOTHY S OPYD IMPRESSIONS  1. 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 . Left ventricular ejection fraction, by estimation, is <20%. Left ventricular ejection fraction by 3D volume is 19 %. The left ventricle has severely decreased function. The left ventricle demonstrates global hypokinesis. The left ventricular internal cavity size was moderately to severely dilated. Left ventricular diastolic parameters are indeterminate.  2. Right ventricular systolic function is normal. The right ventricular size is normal.  3. The mitral valve is grossly normal. Moderate to severe mitral valve regurgitation.  4. The aortic valve is normal in structure. Aortic valve regurgitation is not visualized. No aortic stenosis is present. FINDINGS  Left Ventricle: Definity contrast reveals linear filling defects close to  the apex - likely LV mural thrombi. Left ventricular ejection fraction, by estimation, is <20%. Left ventricular ejection fraction by 3D volume is 19 %. The left ventricle has severely decreased function. The left ventricle demonstrates global hypokinesis. Definity contrast agent was given IV to delineate the left ventricular endocardial borders. The left ventricular internal cavity size was moderately to severely dilated. There is no left ventricular hypertrophy. Left ventricular diastolic parameters are indeterminate. Right Ventricle: The right ventricular size is normal. Right vetricular wall thickness was not well visualized. Right ventricular systolic function is normal. Left Atrium: Left atrial size was normal in size. Right Atrium: Right atrial size was normal in size. Pericardium: There is no evidence of pericardial effusion. Mitral Valve: The mitral valve is grossly normal. Moderate to severe mitral valve regurgitation. Tricuspid Valve: The tricuspid valve is grossly normal. Tricuspid valve regurgitation is mild. Aortic Valve: The aortic valve is normal in structure. Aortic valve regurgitation is not visualized. No aortic stenosis is present. Pulmonic Valve: The pulmonic valve was normal in structure. Pulmonic valve regurgitation is not visualized. No evidence of pulmonic stenosis. Aorta: The aortic root and ascending aorta are structurally normal, with no evidence of dilitation. IAS/Shunts: The atrial septum is grossly normal.  LEFT VENTRICLE PLAX 2D LVIDd:         6.40 cm         Diastology LVIDs:         5.70 cm         LV e' medial:    7.73 cm/s LV PW:         0.70 cm         LV E/e' medial:  14.6 LV IVS:        0.80 cm         LV e' lateral:   15.60 cm/s LVOT diam:     1.60 cm         LV E/e' lateral: 7.2 LV SV:         23 LV SV Index:   12 LVOT Area:     2.01 cm        3D Volume EF  LV 3D EF:    Left                                             ventricul LV Volumes (MOD)                             ar LV vol d, MOD    187.0 ml                   ejection A2C:                                        fraction LV vol d, MOD    198.0 ml                   by 3D A4C:                                        volume is LV vol s, MOD    146.0 ml                   19 %. A2C: LV vol s, MOD    172.0 ml A4C:                           3D Volume EF: LV SV MOD A2C:   41.0 ml       3D EF:        19 % LV SV MOD A4C:   198.0 ml      LV EDV:       218 ml LV SV MOD BP:    29.0 ml       LV ESV:       178 ml                                LV SV:        40 ml RIGHT VENTRICLE             IVC RV S prime:     11.30 cm/s  IVC diam: 1.30 cm TAPSE (M-mode): 2.0 cm LEFT ATRIUM             Index       RIGHT ATRIUM          Index LA diam:        3.80 cm 2.08 cm/m  RA Area:     8.09 cm LA Vol (A2C):   58.8 ml 32.16 ml/m RA Volume:   14.30 ml 7.82 ml/m LA Vol (A4C):   48.0 ml 26.25 ml/m LA Biplane Vol: 56.9 ml 31.12 ml/m  AORTIC VALVE             PULMONIC VALVE LVOT Vmax:   83.10 cm/s  PR End Diast Vel: 2.38 msec LVOT Vmean:  55.200 cm/s LVOT VTI:    0.112 m  AORTA Ao Root diam: 2.20 cm Ao Asc diam:  2.40 cm MITRAL VALVE MV Area (PHT): 5.84 cm      SHUNTS MV Decel Time:  130 msec      Systemic VTI:  0.11 m MR Peak grad:    85.0 mmHg   Systemic Diam: 1.60 cm MR Mean grad:    52.0 mmHg MR Vmax:         461.00 cm/s MR Vmean:        330.0 cm/s MR PISA:         1.57 cm MR PISA Eff ROA: 13 mm MR PISA Radius:  0.50 cm MV E velocity: 113.00 cm/s Kristeen Miss MD Electronically signed by Kristeen Miss MD Signature Date/Time: 02/05/2021/12:39:21 PM    Final     Microbiology: Recent Results (from the past 240 hour(s))  Resp Panel by RT-PCR (Flu A&B, Covid)     Status: None   Collection Time: 02/05/21  2:53 AM  Result Value Ref Range Status   SARS Coronavirus 2 by RT PCR NEGATIVE NEGATIVE Final    Comment: (NOTE) SARS-CoV-2 target nucleic acids are NOT DETECTED.  The SARS-CoV-2 RNA is generally detectable in upper  respiratory specimens during the acute phase of infection. The lowest concentration of SARS-CoV-2 viral copies this assay can detect is 138 copies/mL. A negative result does not preclude SARS-Cov-2 infection and should not be used as the sole basis for treatment or other patient management decisions. A negative result may occur with  improper specimen collection/handling, submission of specimen other than nasopharyngeal swab, presence of viral mutation(s) within the areas targeted by this assay, and inadequate number of viral copies(<138 copies/mL). A negative result must be combined with clinical observations, patient history, and epidemiological information. The expected result is Negative.  Fact Sheet for Patients:  BloggerCourse.com  Fact Sheet for Healthcare Providers:  SeriousBroker.it  This test is no t yet approved or cleared by the [place] FDA and  has been authorized for detection and/or diagnosis of SARS-CoV-2 by FDA under an Emergency Use Authorization (EUA). This EUA will remain  in effect (meaning this test can be used) for the duration of the COVID-19 declaration under Section 564(b)(1) of the Act, 21 U.S.C.section 360bbb-3(b)(1), unless the authorization is terminated  or revoked sooner.       Influenza A by PCR NEGATIVE NEGATIVE Final   Influenza B by PCR NEGATIVE NEGATIVE Final    Comment: (NOTE) The Xpert Xpress SARS-CoV-2/FLU/RSV plus assay is intended as an aid in the diagnosis of influenza from Nasopharyngeal swab specimens and should not be used as a sole basis for treatment. Nasal washings and aspirates are unacceptable for Xpert Xpress SARS-CoV-2/FLU/RSV testing.  Fact Sheet for Patients: BloggerCourse.com  Fact Sheet for Healthcare Providers: SeriousBroker.it  This test is not yet approved or cleared by the [place] FDA and has been  authorized for detection and/or diagnosis of SARS-CoV-2 by FDA under an Emergency Use Authorization (EUA). This EUA will remain in effect (meaning this test can be used) for the duration of the COVID-19 declaration under Section 564(b)(1) of the Act, 21 U.S.C. section 360bbb-3(b)(1), unless the authorization is terminated or revoked.  Performed at [facility], [address] [place], [place] [address]      Labs: Basic Metabolic Panel: Recent Labs  Lab 02/09/21 0400 02/10/21 0422 02/11/21 0337 02/12/21 0339 02/13/21 0342 02/14/21 0341  NA 137 136  --  132* 137 137  K 4.1 4.3  --  4.1 4.6 4.5  CL 104 103  --  103 105 105  CO2 25 21*  --  22 23 22   GLUCOSE 100* 85  --  89 82 88  BUN 21* 17  --  17 16 16   CREATININE 1.10* 1.11*  --  0.95 0.99 1.10*  CALCIUM 8.9 9.2  --  8.7* 9.5 9.4  MG  --   --  2.2  --  2.2 2.1   Liver Function Tests: No results for input(s): AST, ALT, ALKPHOS, BILITOT, PROT, ALBUMIN in the last 168 hours. No results for input(s): LIPASE, AMYLASE in the last 168 hours. No results for input(s): AMMONIA in the last 168 hours. CBC: Recent Labs  Lab 02/09/21 0400 02/10/21 0422 02/12/21 0339 02/13/21 0342 02/14/21 0341  WBC 6.5 6.6 5.9 5.1 5.1  NEUTROABS  --   --  2.5 2.1 2.4  HGB 11.8* 12.9 11.6* 11.6* 11.6*  HCT 34.7* 37.9 34.4* 34.4* 35.0*  MCV 83.4 83.7 82.7 83.3 84.1  PLT 211 206 180 180 188   Cardiac Enzymes: No results for input(s): CKTOTAL, CKMB, CKMBINDEX, TROPONINI in the last 168 hours. BNP: BNP (last 3 results) Recent Labs    02/05/21 0314  BNP 506.5*    ProBNP (last 3 results) No results for input(s): PROBNP in the last 8760 hours.  CBG: No results for input(s): GLUCAP in the last 168 hours.     Signed:  04/07/21 MD.  [facility] 02/14/2021, 12:39 PM

## 2021-02-14 NOTE — Progress Notes (Signed)
Pt discharged to home, instructions reviewed with pt and mother. Acknowledged understanding.. SRP,RN 

## 2021-02-14 NOTE — Plan of Care (Signed)

## 2021-02-19 ENCOUNTER — Other Ambulatory Visit: Payer: Self-pay

## 2021-02-19 ENCOUNTER — Encounter: Payer: Self-pay | Admitting: Nurse Practitioner

## 2021-02-19 ENCOUNTER — Ambulatory Visit (INDEPENDENT_AMBULATORY_CARE_PROVIDER_SITE_OTHER): Payer: Self-pay | Admitting: Nurse Practitioner

## 2021-02-19 VITALS — BP 94/55 | HR 96 | Temp 98.6°F | Ht 59.0 in | Wt 193.0 lb

## 2021-02-19 DIAGNOSIS — I513 Intracardiac thrombosis, not elsewhere classified: Secondary | ICD-10-CM

## 2021-02-19 DIAGNOSIS — I509 Heart failure, unspecified: Secondary | ICD-10-CM

## 2021-02-19 DIAGNOSIS — Z0000 Encounter for general adult medical examination without abnormal findings: Secondary | ICD-10-CM

## 2021-02-19 DIAGNOSIS — I9589 Other hypotension: Secondary | ICD-10-CM

## 2021-02-19 LAB — POCT GLYCOSYLATED HEMOGLOBIN (HGB A1C): Hemoglobin A1C: 5.2 % (ref 4.0–5.6)

## 2021-02-19 MED ORDER — MIDODRINE HCL 5 MG PO TABS: 10.0000 mg | ORAL_TABLET | Freq: Three times a day (TID) | ORAL | 0 refills | Status: AC

## 2021-02-19 NOTE — Progress Notes (Signed)
[facility] Patient [facility] [address] [place], [place]  [address] Phone:  [phone]   Fax:  [phone] Subjective:   Patient ID: Anna [name], female    DOB: Sep 25, 1995, 25 y.o.   MRN: [id number]  Chief Complaint  Patient presents with   Establish Care    Hosp-AdmissionN9/10/2020 - 02/14/2021  New onset of congestive heart failure [facility])   HPI Anna [name] 25 y.o. female presents to establish PCP after hospital admission, 9/6 to 9/15.   Patient states that she went to the ED with worsening shortness of breath on 9/6,  x 3-4 days. Patient states that symptoms have resolved. Currently denies any chest pain, shortness of breath or dizziness. Patient denies any numbness or tingling. Denies any HA today, but states that she has chronic HA's that she typically manages with tylenol and/ ibuprofen. Patient is aware that she can no longer take ibuprofen for pain management. Currently compliant with all medications. Denies currently taking B/P at home. Patient denies adhering to any diet or exercise regimen. Denies any other complaints today.  Past Medical History:  Diagnosis Date   Medical history non-contributory     Past Surgical History:  Procedure Laterality Date   RIGHT/LEFT HEART CATH AND CORONARY ANGIOGRAPHY N/A 02/07/2021   Procedure: RIGHT/LEFT HEART CATH AND CORONARY ANGIOGRAPHY;  Surgeon: [name], MD;  Location: [facility] INVASIVE CV LAB;  Service: Cardiovascular;  Laterality: N/A;    Family History  Problem Relation Age of Onset   Cancer Maternal Great-grandmother     Social History   Socioeconomic History   Marital status: Unknown    Spouse name: Not on file   Number of children: Not on file   Years of education: Not on file   Highest education level: Not on file  Occupational History   Not on file  Tobacco Use   Smoking status: Never   Smokeless tobacco: Never  Substance and Sexual Activity   Alcohol use: Yes    Comment: drinks socially   Drug use:  Never   Sexual activity: Not on file  Other Topics Concern   Not on file  Social History Narrative   Not on file   Social Determinants of Health   Financial Resource Strain: Not on file  Food Insecurity: Not on file  Transportation Needs: Not on file  Physical Activity: Not on file  Stress: Not on file  Social Connections: Not on file  Intimate Partner Violence: Not on file    Outpatient Medications Prior to Visit  Medication Sig Dispense Refill   carvedilol (COREG) 6.25 MG tablet Take 1 tablet (6.25 mg total) by mouth 2 (two) times daily with a meal. 60 tablet 1   sacubitril-valsartan (ENTRESTO) 24-26 MG Take 1 tablet by mouth 2 (two) times daily. 60 tablet 1   warfarin (COUMADIN) 10 MG tablet Take 1 tablet (10 mg total) by mouth daily. 30 tablet 1   midodrine (PROAMATINE) 5 MG tablet Take 1 tablet (5 mg total) by mouth 3 (three) times daily with meals. 90 tablet 1   Acetaminophen-DM (VICKS DAYQUIL HBP COLD & FLU) 325-10 MG CAPS Take 2 capsules by mouth every 6 (six) hours as needed (cold symptoms).     No facility-administered medications prior to visit.    No Known Allergies  Review of Systems  Constitutional:  Negative for chills, fever and malaise/fatigue.  Eyes: Negative.   Respiratory:  Negative for cough and shortness of breath.   Cardiovascular:  Negative for chest pain,  palpitations and leg swelling.  Gastrointestinal:  Negative for abdominal pain, blood in stool, constipation, diarrhea, nausea and vomiting.  Musculoskeletal: Negative.   Skin: Negative.   Neurological: Negative.   Psychiatric/Behavioral:  Negative for depression. The patient is not nervous/anxious.   All other systems reviewed and are negative.     Objective:    Physical Exam Vitals reviewed.  Constitutional:      General: She is not in acute distress.    Appearance: Normal appearance. She is normal weight.  HENT:     Head: Normocephalic.     Right Ear: Tympanic membrane, ear canal and  external ear normal.     Left Ear: Tympanic membrane, ear canal and external ear normal.     Nose: Nose normal.     Mouth/Throat:     Mouth: Mucous membranes are moist.  Eyes:     Extraocular Movements: Extraocular movements intact.     Conjunctiva/sclera: Conjunctivae normal.     Pupils: Pupils are equal, round, and reactive to light.  Cardiovascular:     Rate and Rhythm: Normal rate and regular rhythm.     Pulses: Normal pulses.     Heart sounds: Normal heart sounds.     Comments: No obvious peripheral edema Pulmonary:     Effort: Pulmonary effort is normal.     Breath sounds: Normal breath sounds.  Musculoskeletal:        General: Normal range of motion.     Cervical back: Normal range of motion and neck supple.  Skin:    General: Skin is warm and dry.     Capillary Refill: Capillary refill takes less than 2 seconds.  Neurological:     General: No focal deficit present.     Mental Status: She is alert and oriented to person, place, and time.  Psychiatric:        Mood and Affect: Mood normal.        Behavior: Behavior normal.        Thought Content: Thought content normal.        Judgment: Judgment normal.    BP (!) 94/55   Pulse 96   Temp 98.6 F (37 C)   Ht 4\' 11"  (1.499 m)   Wt 193 lb 0.3 oz (87.6 kg)   LMP 02/13/2021   SpO2 99%   BMI 38.99 kg/m  Wt Readings from Last 3 Encounters:  02/19/21 193 lb 0.3 oz (87.6 kg)  02/14/21 189 lb 6 oz (85.9 kg)  03/02/20 190 lb (86.2 kg)     There is no immunization history on file for this patient.  Diabetic Foot Exam - Simple   No data filed     Lab Results  Component Value Date   TSH 1.950 02/05/2021   Lab Results  Component Value Date   WBC 5.1 02/14/2021   HGB 11.6 (L) 02/14/2021   HCT 35.0 (L) 02/14/2021   MCV 84.1 02/14/2021   PLT 188 02/14/2021   Lab Results  Component Value Date   NA 137 02/14/2021   K 4.5 02/14/2021   CO2 22 02/14/2021   GLUCOSE 88 02/14/2021   BUN 16 02/14/2021   CREATININE  1.10 (H) 02/14/2021   BILITOT 1.0 02/06/2021   ALKPHOS 53 02/06/2021   AST 18 02/06/2021   ALT 20 02/06/2021   PROT 7.4 02/06/2021   ALBUMIN 3.9 02/06/2021   CALCIUM 9.4 02/14/2021   ANIONGAP 10 02/14/2021   No results found for: CHOL No results found for: HDL  No results found for: LDLCALC No results found for: TRIG No results found for: CHOLHDL No results found for: RUEA5W     Assessment & Plan:   Problem List Items Addressed This Visit       Cardiovascular and Mediastinum   New onset of congestive heart failure (HCC)   Relevant Medications   midodrine (PROAMATINE) 5 MG tablet   Other Relevant Orders   Lipid Panel   POCT glycosylated hemoglobin (Hb A1C)   Protime-INR   CBC with Differential/Platelet   Comprehensive metabolic panel   Magnesium   Ambulatory referral to Cardiology Discussed diet and exercise options Encouraged continued compliance with medications   LV (left ventricular) mural thrombus   Relevant Medications   midodrine (PROAMATINE) 5 MG tablet (dose increased due to hypotension in clinic today)  Patient informed to begin taking B/P at home and discussed red flag vitals and symptoms that require emergent intervention    Other Relevant Orders   Lipid Panel   POCT glycosylated hemoglobin (Hb A1C)   Protime-INR   CBC with Differential/Platelet   Comprehensive metabolic panel   Magnesium   Ambulatory referral to Cardiology   Other Visit Diagnoses     Healthcare maintenance    -  Primary   Other specified hypotension       Relevant Medications   midodrine (PROAMATINE) 5 MG tablet   Follow up in 1 mth for reevaluation of B/P and labs, sooner as needed    I have discontinued Anna [name]'s Vicks DayQuil HBP Cold & Flu. I have also changed her midodrine. Additionally, I am having her maintain her carvedilol, sacubitril-valsartan, and warfarin.  Meds ordered this encounter  Medications   midodrine (PROAMATINE) 5 MG tablet    Sig: Take 2 tablets  (10 mg total) by mouth 3 (three) times daily with meals.    Dispense:  180 tablet    Refill:  0     Kathrynn Speed, NP

## 2021-02-19 NOTE — Patient Instructions (Signed)
You were seen today in the [facility] for reevaluation of chronic illness and follow up after recent admission. Labs were collected, results will be available via MyChart or, if abnormal, you will be contacted by clinic staff. You were prescribed medications, please take as directed. Please follow up in 1 mth for reevaluation and labs. Please take your B/P once daily.

## 2021-02-20 ENCOUNTER — Other Ambulatory Visit: Payer: Self-pay | Admitting: Nurse Practitioner

## 2021-02-20 DIAGNOSIS — Z7901 Long term (current) use of anticoagulants: Secondary | ICD-10-CM

## 2021-02-20 LAB — COMPREHENSIVE METABOLIC PANEL
ALT: 22 IU/L (ref 0–32)
AST: 18 IU/L (ref 0–40)
Albumin/Globulin Ratio: 1.4 (ref 1.2–2.2)
Albumin: 4.2 g/dL (ref 3.9–5.0)
Alkaline Phosphatase: 64 IU/L (ref 44–121)
BUN/Creatinine Ratio: 8 — ABNORMAL LOW (ref 9–23)
BUN: 8 mg/dL (ref 6–20)
Bilirubin Total: 0.3 mg/dL (ref 0.0–1.2)
CO2: 22 mmol/L (ref 20–29)
Calcium: 9 mg/dL (ref 8.7–10.2)
Chloride: 106 mmol/L (ref 96–106)
Creatinine, Ser: 1.02 mg/dL — ABNORMAL HIGH (ref 0.57–1.00)
Globulin, Total: 3 g/dL (ref 1.5–4.5)
Glucose: 98 mg/dL (ref 65–99)
Potassium: 4.2 mmol/L (ref 3.5–5.2)
Sodium: 141 mmol/L (ref 134–144)
Total Protein: 7.2 g/dL (ref 6.0–8.5)
eGFR: 78 mL/min/{1.73_m2} (ref >59)

## 2021-02-20 LAB — LIPID PANEL
Chol/HDL Ratio: 4 ratio (ref 0.0–4.4)
Cholesterol, Total: 169 mg/dL (ref 100–199)
HDL: 42 mg/dL (ref >39)
LDL Chol Calc (NIH): 117 mg/dL — ABNORMAL HIGH (ref 0–99)
Triglycerides: 50 mg/dL (ref 0–149)
VLDL Cholesterol Cal: 10 mg/dL (ref 5–40)

## 2021-02-20 LAB — CBC WITH DIFFERENTIAL/PLATELET
Basophils Absolute: 0 10*3/uL (ref 0.0–0.2)
Basos: 1 %
EOS (ABSOLUTE): 0 10*3/uL (ref 0.0–0.4)
Eos: 0 %
Hematocrit: 33.7 % — ABNORMAL LOW (ref 34.0–46.6)
Hemoglobin: 11.4 g/dL (ref 11.1–15.9)
Immature Grans (Abs): 0 10*3/uL (ref 0.0–0.1)
Immature Granulocytes: 0 %
Lymphocytes Absolute: 2.1 10*3/uL (ref 0.7–3.1)
Lymphs: 42 %
MCH: 28.2 pg (ref 26.6–33.0)
MCHC: 33.8 g/dL (ref 31.5–35.7)
MCV: 83 fL (ref 79–97)
Monocytes Absolute: 0.6 10*3/uL (ref 0.1–0.9)
Monocytes: 11 %
Neutrophils Absolute: 2.3 10*3/uL (ref 1.4–7.0)
Neutrophils: 46 %
Platelets: 280 10*3/uL (ref 150–450)
RBC: 4.04 x10E6/uL (ref 3.77–5.28)
RDW: 16.2 % — ABNORMAL HIGH (ref 11.7–15.4)
WBC: 5 10*3/uL (ref 3.4–10.8)

## 2021-02-20 LAB — PROTIME-INR
INR: 6.2 (ref 0.9–1.2)
Prothrombin Time: 59.2 s — ABNORMAL HIGH (ref 9.1–12.0)

## 2021-02-20 LAB — MAGNESIUM: Magnesium: 2 mg/dL (ref 1.6–2.3)

## 2021-02-22 ENCOUNTER — Ambulatory Visit (INDEPENDENT_AMBULATORY_CARE_PROVIDER_SITE_OTHER): Payer: Self-pay | Admitting: Cardiology

## 2021-02-22 ENCOUNTER — Other Ambulatory Visit: Payer: Self-pay | Admitting: Nurse Practitioner

## 2021-02-22 ENCOUNTER — Other Ambulatory Visit: Payer: Self-pay

## 2021-02-22 VITALS — BP 82/54 | HR 105 | Ht 59.0 in | Wt 194.0 lb

## 2021-02-22 DIAGNOSIS — I513 Intracardiac thrombosis, not elsewhere classified: Secondary | ICD-10-CM

## 2021-02-22 DIAGNOSIS — I959 Hypotension, unspecified: Secondary | ICD-10-CM

## 2021-02-22 DIAGNOSIS — I472 Ventricular tachycardia: Secondary | ICD-10-CM

## 2021-02-22 DIAGNOSIS — I4729 Other ventricular tachycardia: Secondary | ICD-10-CM

## 2021-02-22 DIAGNOSIS — I428 Other cardiomyopathies: Secondary | ICD-10-CM

## 2021-02-22 MED ORDER — METOPROLOL SUCCINATE ER 25 MG PO TB24: 12.5000 mg | ORAL_TABLET | Freq: Every day | ORAL | 3 refills | Status: AC

## 2021-02-22 MED ORDER — BLOOD PRESSURE CUFF MISC: 0 refills | Status: AC

## 2021-02-22 NOTE — Patient Instructions (Addendum)
Follow up with Luther Parody NP in about 3 weeks  Dr. Cristal Deer has referred you to the Heart Failure Clinic - this is located on the [facility] campus  Please take the blood pressure cuff order to a pharmacy to see if you can get an ARM CUFF   Do the following things EVERY DAY:  Weigh yourself EVERY morning after you go to the bathroom but before you eat or drink anything. Write this number down in a weight log/diary. If you gain 3 pounds overnight or 5 pounds in a week, call the office.  Take your medicines as prescribed. If you have concerns about your medications, please call us before you stop taking them.   Eat low salt foods--Limit salt (sodium) to 2000 mg per day. This will help prevent your body from holding onto fluid. Read food labels as many processed foods have a lot of sodium, especially canned goods and prepackaged meats. If you would like some assistance choosing low sodium foods, we would be happy to set you up with a nutritionist.  Stay as active as you can everyday. Staying active will give you more energy and make your muscles stronger. Start with 5 minutes at a time and work your way up to 30 minutes a day. Break up your activities--do some in the morning and some in the afternoon. Start with 3 days per week and work your way up to 5 days as you can.  If you have chest pain, feel short of breath, dizzy, or lightheaded, STOP. If you don't feel better after a short rest, call 911. If you do feel better, call the office to let us know you have symptoms with exercise.  Limit all fluids for the day to less than 2 liters. Fluid includes all drinks, coffee, juice, ice chips, soup, jello, and all other liquids.   -counseled on how to check blood pressure:  -sit comfortably in a chair, feet uncrossed and flat on floor, for 5-10 minutes  -arm ideally should rest at the level of the heart. However, arm should be relaxed and not tense (for example, do not hold the arm up  unsupported)  -avoid exercise, caffeine, and tobacco for at least 30 minutes prior to BP reading  -don't take BP cuff reading over clothes (always place on skin directly)  -I prefer to know how well the medication is working, so I would like you to take your readings 1-2 hours after taking your blood pressure medication if possible   Medication changes: Stop entresto and carvedilol for now. Hold on to them for now but do not take We are going to start metoprolol succinate to replace the carvedilol For now, keep taking midodrine 10 mg three times a day. Once you have a blood pressure cuff, we can scale the midodrine down. I would like your blood pressure to be at least 100 on the top number I would take the metoprolol at bedtime

## 2021-02-22 NOTE — Progress Notes (Signed)
Cardiology Office Note:    Date:  02/22/2021   ID:  Anna [name], DOB 09-25-1995, MRN [id number]  PCP:  Kathrynn Speed, NP  Cardiologist:  Jodelle Red, MD  Referring MD: Orion Crook I, NP   CC: new patient consultation for heart failure and LV mural thrombus   History of Present Illness:    Anna [name] is a 25 y.o. female with no significant pmhx, who is seen as a new consult at the request of [place], Enid Derry I, NP for the evaluation and management of acute onset congestive heart failure and left ventricular mural thrombus.  Records from hospitalization at [facility] 01/2021 reviewed, see studies below. Heart failure diagnosed during hospitalization earlier in 01/2021, was seen by Drs. Algie Coffer and Harwani during that hospitalization. She would like to transfer care to [facility].  Cardiovascular risk factors: Prior clinical ASCVD: None Comorbid conditions, including hypertension, hyperlipidemia, diabetes, chronic kidney disease: None Metabolic syndrome/Obesity: BMI 36 Chronic inflammatory conditions: none Tobacco use history: never Family history: No known cardiovascular history. Prior cardiac testing and/or incidental findings on other testing (ie coronary calcium): None Exercise level: Overall she is feeling better, but does not yet feel at baseline. She continues to feel generally fatigued, but is working on building her strength. She is doing better and is able to walk through [employer] without much issue. Previously she could not complete her morning routine without resting. Plays sports often. Current diet: Drinks large amounts of water (gallons), Avoids soda.  She is accompanied by her mother. Her main concern today is to establish a definite plan for cardiovascular care.  Of note, she states her blood pressure was normal before beginning her new medication regimen. She was taking 5 mg midodrine 3 tablets a day, but is now taking 6 tablets daily  per her PCP.  In the past she has had episodes of near-syncope and syncope. She is unsure if they were due to low blood pressure. Recently she has not had any syncopal episodes, and denies any lightheadedness.  Her INR is followed by her PCP. She reports recent lab work was completed, including a lipid panel, INR, and CBC.  She has never been pregnant and does not plan to become pregnant at this time.  Additionally, she has lingering ecchymosis on her bilateral UE from her recent hospital admission.  She denies any palpitations, chest pain, or shortness of breath. No headaches, orthopnea, or PND. Also has no lower extremity edema or exertional symptoms.   Past Medical History:  Diagnosis Date   Medical history non-contributory     Past Surgical History:  Procedure Laterality Date   RIGHT/LEFT HEART CATH AND CORONARY ANGIOGRAPHY N/A 02/07/2021   Procedure: RIGHT/LEFT HEART CATH AND CORONARY ANGIOGRAPHY;  Surgeon: [name], MD;  Location: [facility] INVASIVE CV LAB;  Service: Cardiovascular;  Laterality: N/A;    Current Medications: Current Outpatient Medications on File Prior to Visit  Medication Sig   midodrine (PROAMATINE) 5 MG tablet Take 2 tablets (10 mg total) by mouth 3 (three) times daily with meals.   warfarin (COUMADIN) 10 MG tablet Take 1 tablet (10 mg total) by mouth daily.   No current facility-administered medications on file prior to visit.     Allergies:   Patient has no known allergies.   Social History   Tobacco Use   Smoking status: Never   Smokeless tobacco: Never  Substance Use Topics   Alcohol use: Yes    Comment: drinks socially   Drug use: Never  Family History: family history includes Cancer in her maternal great-grandmother.  ROS:   Please see the history of present illness.  Additional pertinent ROS: Constitutional: Positive for fatigue. Negative for chills, fever, night sweats, unintentional weight loss  HENT: Negative for ear pain and  hearing loss.   Eyes: Negative for loss of vision and eye pain.  Respiratory: Negative for cough, sputum, wheezing.   Cardiovascular: See HPI. Gastrointestinal: Negative for abdominal pain, melena, and hematochezia.  Genitourinary: Negative for dysuria and hematuria.  Musculoskeletal: Negative for falls and myalgias.  Skin: Negative for itching and rash.  Neurological: Negative for focal weakness, focal sensory changes and loss of consciousness.  Endo/Heme/Allergies: Does not bruise/bleed easily.     EKGs/Labs/Other Studies Reviewed:    The following studies were reviewed today:  Right/Left Heart Cath 02/07/2021:   LV end diastolic pressure is normal.   LV end diastolic pressure is normal.   The left ventricular ejection fraction is less than 25% by visual estimate.   Continue medications for CHF except decrease diuretics use.  Echo 02/05/2021:  1. Definity contrast reveals linear filling defects close to the apex -  likely LV mural thrombi. . Left ventricular ejection fraction, by  estimation, is <20%. Left ventricular ejection fraction by 3D volume is 19  %. The left ventricle has severely  decreased function. The left ventricle demonstrates global hypokinesis.  The left ventricular internal cavity size was moderately to severely  dilated. Left ventricular diastolic parameters are indeterminate.   2. Right ventricular systolic function is normal. The right ventricular  size is normal.   3. The mitral valve is grossly normal. Moderate to severe mitral valve  regurgitation.   4. The aortic valve is normal in structure. Aortic valve regurgitation is  not visualized. No aortic stenosis is present.   CTA Chest 02/05/2021: COMPARISON:  Chest x-ray from same day.   FINDINGS: Cardiovascular: Satisfactory opacification of the pulmonary arteries to the segmental level. No evidence of pulmonary embolism. Cardiomegaly. No pericardial effusion. No thoracic aortic aneurysm.    Mediastinum/Nodes: No enlarged mediastinal, hilar, or axillary lymph nodes. Thyroid gland, trachea, and esophagus demonstrate no significant findings.   Lungs/Pleura: Scattered smooth interlobular septal thickening. Patchy irregular ground-glass densities in both upper lobes, and to a lesser extent in the right lower lobe. Small right and trace left pleural effusions. Small amount of fluid along the left major fissure. No pneumothorax.   Upper Abdomen: No acute abnormality.   Musculoskeletal: No chest wall abnormality. No acute or significant osseous findings.   Review of the MIP images confirms the above findings.   IMPRESSION: 1. No evidence of pulmonary embolism. 2. Congestive heart failure. Mild interstitial and alveolar pulmonary edema with small right and trace left pleural effusions.  [facility] 02/27/21 Findings  Right Atrium is mildly elevated.  Right Ventricle is mildly elevated.  Pulmonary Artery is mildly elevated.  Pulmonary capillary wedge pressure is mildly to moderately elevated.  There is mild Pulmonary Hypertension.  Mixed Venous Saturation is mildly reduced.  Cardiac Output is low normal  by thermodilution.  Cardiac Output is normal  by Fick.   There is no Systemic Hypertension.   Procedural Statistics   Pressures    RA 10/5/7  RV  40/2/8  PA  36/28/30  PCW  22/34/22   BP  96/58/71   PA Saturation O2-Sat  59%    Derived Parameters    PVR 1.98 Woods units   SVR 1270 dyn/sec/cm-5  Mean Arterial Pressure (MAP): 71  Cardiac Output (CO) Fick:  4.44 l/min  Cardiac Index (CI) Fick:  2.47 l/min/m2  Cardiac Output (CO) Thermodilution:  4.03 l/min  Cardiac Index (CI) Thermodilution:  2.24 l/min/m2   Transpulmonary Pressure Gradient (TPG) in mmHg: 8   Pulmonary Artery Pulsatility Index (PAPI): 1.2  Cardiac Power Output [facility]): 0.63   Complications:  none   Recommendations:  Based on Right Heart Cath, decided to return to inpatient status for   further medical management of heart failure   Cardiac MRI [facility] 02/26/21 RIGHT VENTRICULAR STRUCTURE/FUNCTION  RVEDV (ml): 115                           RVESV (ml): 86             RVEF  (%):26           HEMODYNAMIC AND FUNCTIONAL DATA  LV ejection fraction = 22%                                                      LVESV (ml): 224                              LVESI (ml/m2): 124.44              LV Stroke volume (ml): 66                    LV Stroke Index (ml/m2): 36.66    LVEDV (ml): 290                              LVEDI (ml/m2): 161.11              LV Mass (grams): 110                         LV Mass/Volume (g/m2): 61.11                  INTERPRETATION  1. The left ventricular (LV) size is severely dilated with markedly thin wall. Septal and posterior walls measures 5 mm. The indexed LV end diastolic volume is 161.11 mL/m2. There is severely reduced systolic function of the LV with an ejection fraction of 22 %. There is severely global hypokinesis without regional variation. No LV myocardial hypoperfusion seen on the 1st pass study. There is no late gadolinium enhancement of the LV myocardium. No LV thrombus.                                        There is prominent LV trabeculation. Non-cpmpacted to compacted myocardium is >2.3:1. Anterior papillary muscle is better formed than posterior papillary muscle.  2. The right ventricle (RV) size is normal with indexed RV end diastolic volume of 115 mL/m2. The RV systolic function is severely reduced with an ejection fraction of 26%.                                       3. Nomral size RA and dilated LA.                                                          4. Mild MR ( Moderate MR on the most recent echo). Otherwise no  evidence of significant valvular abnormalities. AV is not well imaged  but no AR or AS seen. PValve cannot be assessed.                                             5. Normal pericardium with a smal simple pericardial effusion. No  pericardial enhancement.                                                                    6. Trace simple right effusion. Otherwise no clinically relevant extracardiac findings.                                                                      Severely dilated LA and LV with markedly thin wall and severely reduced                    LV systolic funciton. There is hypokinesis without regional variation.                      No LV myocardial hypoperfusion on the 1st pass study, suggestive of non  ischemic etiology. No LGE or LV thrombus. Prominent LV trabeculation with non-compacted to compacted myocardium is >2.3:1. This may  represent dilated cardiomyopathy, unknown cause (possible causes  include familial cardiomyopathy or prior myocarditis-the patient has  history of covid infecction, LC non-compaction is thought to be less  likley).                                                                                    Normal size RV wiht severely reduced funciton. Dilated LA.  Mild MR, likley form annular dilatation.                 EKG:  EKG is personally reviewed.   02/22/2021: sinus tachycardia at 105 bpm  Recent Labs: 02/05/2021: B Natriuretic Peptide 506.5; TSH 1.950 02/19/2021: ALT 22; BUN 8; Creatinine, Ser 1.02; Hemoglobin 11.4; Magnesium 2.0; Platelets 280; Potassium 4.2; Sodium 141   Recent Lipid Panel    Component Value Date/Time   CHOL 169 02/19/2021 1538   TRIG 50 02/19/2021 1538   HDL 42 02/19/2021 1538   CHOLHDL 4.0 02/19/2021 1538   LDLCALC 117 (H) 02/19/2021 1538    Physical Exam:    VS:  BP (!) 82/54   Pulse (!) 105   Ht 4\' 11"  (1.499 m)   Wt 194 lb (88 kg)   LMP 02/13/2021   BMI 39.18 kg/m     Wt Readings from Last 3 Encounters:  02/22/21 194 lb (88 kg)  02/19/21 193 lb 0.3 oz (87.6 kg)  02/14/21 189 lb 6 oz (85.9 kg)    GEN: Well  nourished, well developed in no acute distress HEENT: Normal, moist mucous membranes NECK: No JVD CARDIAC: regular rhythm, normal S1 and S2, no rubs or gallops. No murmur. VASCULAR: Radial and DP pulses 2+ bilaterally. No carotid bruits RESPIRATORY:  Clear to auscultation without rales, wheezing or rhonchi  ABDOMEN: Soft, non-tender, non-distended MUSCULOSKELETAL:  Ambulates independently SKIN: Warm and dry, no edema NEUROLOGIC:  Alert and oriented x 3. No focal neuro deficits noted. PSYCHIATRIC:  Normal affect    ASSESSMENT:    1. NICM (nonischemic cardiomyopathy) (HCC)   2. LV (left ventricular) mural thrombus   3. NSVT (nonsustained ventricular tachycardia)   4. Hypotension, unspecified hypotension type    PLAN:    Nonischemic cardiomyopathy Hypotension -it is counterproductive to be taking carvedilol and entresto while also requiring midodrine -will change carvedilol to metoprolol to lessen blood pressure effect -hold entresto. Goal is to wean midodrine if able, then can start back low dose ARB if blood pressures allow -appears euvolemic, NYHA class 2  LV thrombus on echo, not seen on MRI -I cannot see MRI images, but specifically commented on lack of LV thrombus -reasonable to continue coumadin for now, then do limited echo with definity to re-evaluate for thrombus -last INR reported as 6.2, INR on hold. Followed by her PCP  Cardiac risk counseling and prevention recommendations: -recommend heart healthy/Mediterranean diet, with whole grains, fruits, vegetable, fish, lean meats, nuts, and olive oil. Limit salt. -recommend moderate walking, 3-5 times/week for 30-50 minutes each session. Aim for at least 150 minutes.week. Goal should be pace of 3 miles/hours, or walking 1.5 miles in 30 minutes -recommend avoidance of tobacco products. Avoid excess alcohol. -ASCVD risk score: The ASCVD Risk score (Arnett DK, et al., 2019) failed to calculate for the following reasons:   The  2019 ASCVD risk score is only valid for ages 47 to 41    Plan for follow up: With APP in 3 weeks.   76, MD, PhD, [facility]  [facility]    Medication Adjustments/Labs and Tests Ordered: Current medicines are reviewed at length with the patient today.  Concerns regarding medicines are outlined above.   Orders Placed This Encounter  Procedures   EKG 12-Lead     Meds ordered this encounter  Medications   metoprolol succinate (TOPROL XL) 25 MG 24 hr tablet    Sig: Take 0.5 tablets (12.5 mg total) by mouth daily.  Dispense:  45 tablet    Refill:  3     Patient Instructions  Do the following things EVERY DAY:  Weigh yourself EVERY morning after you go to the bathroom but before you eat or drink anything. Write this number down in a weight log/diary. If you gain 3 pounds overnight or 5 pounds in a week, call the office.  Take your medicines as prescribed. If you have concerns about your medications, please call us before you stop taking them.   Eat low salt foods--Limit salt (sodium) to 2000 mg per day. This will help prevent your body from holding onto fluid. Read food labels as many processed foods have a lot of sodium, especially canned goods and prepackaged meats. If you would like some assistance choosing low sodium foods, we would be happy to set you up with a nutritionist.  Stay as active as you can everyday. Staying active will give you more energy and make your muscles stronger. Start with 5 minutes at a time and work your way up to 30 minutes a day. Break up your activities--do some in the morning and some in the afternoon. Start with 3 days per week and work your way up to 5 days as you can.  If you have chest pain, feel short of breath, dizzy, or lightheaded, STOP. If you don't feel better after a short rest, call 911. If you do feel better, call the office to let us know you have symptoms with exercise.  Limit all fluids for the day to less  than 2 liters. Fluid includes all drinks, coffee, juice, ice chips, soup, jello, and all other liquids.   -counseled on how to check blood pressure:  -sit comfortably in a chair, feet uncrossed and flat on floor, for 5-10 minutes  -arm ideally should rest at the level of the heart. However, arm should be relaxed and not tense (for example, do not hold the arm up unsupported)  -avoid exercise, caffeine, and tobacco for at least 30 minutes prior to BP reading  -don't take BP cuff reading over clothes (always place on skin directly)  -I prefer to know how well the medication is working, so I would like you to take your readings 1-2 hours after taking your blood pressure medication if possible   Medication changes: Stop entresto and carvedilol for now. Hold on to them for now but do not take We are going to start metoprolol succinate to replace the carvedilol For now, keep taking midodrine 10 mg three times a day. Once you have a blood pressure cuff, we can scale the midodrine down. I would like your blood pressure to be at least 100 on the top number I would take the metoprolol at bedtime    I,Mathew Stumpf,acting as a scribe for Jodelle Red, MD.,have documented all relevant documentation on the behalf of Jodelle Red, MD,as directed by  Jodelle Red, MD while in the presence of Jodelle Red, MD.   I, Jodelle Red, MD, have reviewed all documentation for this visit. The documentation on 03/25/21 for the exam, diagnosis, procedures, and orders are all accurate and complete.   Signed, Jodelle Red, MD PhD 02/22/2021 2:35 PM    [facility]

## 2021-02-24 ENCOUNTER — Encounter (HOSPITAL_BASED_OUTPATIENT_CLINIC_OR_DEPARTMENT_OTHER): Payer: Self-pay

## 2021-03-13 ENCOUNTER — Ambulatory Visit: Payer: Medicaid Other | Admitting: Cardiology

## 2021-03-14 NOTE — Progress Notes (Deleted)
Office Visit    Patient Name: Anna [name] Date of Encounter: 03/14/2021  PCP:  Kathrynn Speed, NP   [facility]  Cardiologist:  Jodelle Red, MD  Advanced Practice Provider:  No care team member to display Electrophysiologist:  None      Chief Complaint    Anna [name] is a 25 y.o. female with a hx of *** presents today for hospital follow up   Past Medical History    Past Medical History:  Diagnosis Date   Medical history non-contributory    Past Surgical History:  Procedure Laterality Date   RIGHT/LEFT HEART CATH AND CORONARY ANGIOGRAPHY N/A 02/07/2021   Procedure: RIGHT/LEFT HEART CATH AND CORONARY ANGIOGRAPHY;  Surgeon: [name], MD;  Location: [facility];  Service: Cardiovascular;  Laterality: N/A;    Allergies  No Known Allergies  History of Present Illness    Anna [name] is a 25 y.o. female with a hx of NICM/HFrEF, IDA, history of LV thrombus last seen 02/22/21 by Dr. Cristal Deer.  She was hospitalized 02/05/21-02/14/21 after presenting with shortness of breath, palpitations. She was diagnosed with new onset systolic heart failure and idiopathic dilated and nonischemic cardiomyopathy. Echo with LVEF <20%, LV global hypokinesis, LV mild to moderately to severely dilated, RV normal, moderate to severe MR, LVmural thrombus. She underwent cardiac cath 02/07/21 with EF <25%, normal LVEDP, normal coronaries. Titration of medications was limited by hypotension. Episodes of nonsustained V. Tach were thought to be due to cardiomyopathy. She was discharged on Entresto, Coreg, Warfarin, and Midodrine. Seen by Dr. Algie Coffer during admission.  She was seen 02/22/21 by Dr. Cristal Deer and referred to Advanced Heart Failure Clinic. She was markedly hypotensive. Entresto and Carvedilol were stopped. Metoprolol Succinate was initiated.   She was admitted to [facility] 02/25/21-02/28/21 with heart failure exacerbation. CXR with some  evidence of pulmonary edema. After diuresis, there was concern for low output heartfailure as she was euvolemic and extremities were cool. Cardiac MRI consistent with nonischemic etiology with no LGE or LV thrombus. Underwent RHC with elevated pressures (RA 10/5/7) and mildly decreased cardiac indices (Fick 2.47, Thermo 2.24). She had workup for nonischemic causes of cardiomyopathy including: EBV negative, HIV negative, UDS negative, sed rate 85, CMV negative, coxsackie A Abs 1:400, coxasackie B Abs ranging 1:16 to 1:64. Entresto was re-attempted but BP did not tolerate. She was discharged on Losartan 12.5mg  BID, Metoprolol succinate 12.5mg  QD, Spironolactone 12.5mg  QD, Lasix 40mg  PRN. Discharge weight 85.7kg. She was recommended to continue Warfarin for 3-6 months due to previously noted LV thrombus and persistently reduced LVEF.    EKGs/Labs/Other Studies Reviewed:   The following studies were reviewed today:  TTE 01/2021 at [facility] Moderate left ventricular hypertrophy. The left ventricular size is normal. LV ejection fraction = 55-60%. Left ventricular filling pattern is prolonged relaxation. The left ventricular wall motion is normal. No significant valvular stenosis or regurgitation. IVC size was normal. There is trivial pericardial effusion. Compard to the study dated 01/16/2019, there is probably no significant change.  Right Heart Cath 01/2021 at [facility] Pressures    RA 10/5/7  RV 40/2/8  PA 36/28/30  PCW 22/34/22   Cardiac Output (CO) Fick: 4.44 l/min Cardiac Index (CI) Fick: 2.47 l/min/m2 Cardiac Output (CO) Thermodilution: 4.03 l/min Cardiac Index (CI) Thermodilution: 2.24 l/min/m2 Transpulmonary Pressure Gradient (TPG) in mmHg: 8 Pulmonary Artery Pulsatility Index (PAPI): 1.2 Cardiac Power Output [facility]): 0.63   Cardiac MRI 01/2021 MR CARDIAC REST [facility] CONTRAST  Final Result  Severely dilated LA and LV with markedly thin wall and severely reduced LV systolic funciton. There is  hypokinesis without regional variation.No LV myocardial hypoperfusion on the 1st pass study, suggestive of non ischemic etiology. No LGE or LV thrombus. Prominent LV trabeculation with non-compacted to compacted myocardium is >2.3:1. This may represent dilated cardiomyopathy, unknown cause (possible causes include familial cardiomyopathy or prior myocarditis-the patient has  history of covid infecction, LC non-compaction is thought to be less likley)   Echo 02/05/21  1. Definity contrast reveals linear filling defects close to the apex -  likely LV mural thrombi. . Left ventricular ejection fraction, by  estimation, is <20%. Left ventricular ejection fraction by 3D volume is 19  %. The left ventricle has severely  decreased function. The left ventricle demonstrates global hypokinesis.  The left ventricular internal cavity size was moderately to severely  dilated. Left ventricular diastolic parameters are indeterminate.   2. Right ventricular systolic function is normal. The right ventricular  size is normal.   3. The mitral valve is grossly normal. Moderate to severe mitral valve  regurgitation.   4. The aortic valve is normal in structure. Aortic valve regurgitation is  not visualized. No aortic stenosis is present.   [facility] 02/07/21 Conclusion      LV end diastolic pressure is normal.   LV end diastolic pressure is normal.   The left ventricular ejection fraction is less than 25% by visual estimate.   Continue medications for CHF except decrease diuretics use.   Coronary Findings  Diagnostic Dominance: Left Left Main  Vessel was injected. Vessel is normal in caliber. Vessel is angiographically normal.  Left Anterior Descending  Vessel was injected. Vessel is normal in caliber. Vessel is angiographically normal.  Ramus Intermedius  Vessel was injected. Vessel is normal in caliber. Vessel is angiographically normal.  Left Circumflex  Vessel was injected. Vessel is normal in caliber.  Vessel is angiographically normal.  Right Coronary Artery  Vessel was injected. Vessel is small. Vessel is angiographically normal.  Wall Motion     by echocardiogram EF 20-25 %.           Left Heart  Left Ventricle The left ventricle is dilated. LV end diastolic pressure is normal. The left ventricular ejection fraction is less than 25% by visual estimate.   Coronary Diagrams  Diagnostic Dominance: Left  EKG:  No EKG today  Recent Labs: 02/05/2021: B Natriuretic Peptide 506.5; TSH 1.950 02/19/2021: ALT 22; BUN 8; Creatinine, Ser 1.02; Hemoglobin 11.4; Magnesium 2.0; Platelets 280; Potassium 4.2; Sodium 141  Recent Lipid Panel    Component Value Date/Time   CHOL 169 02/19/2021 1538   TRIG 50 02/19/2021 1538   HDL 42 02/19/2021 1538   CHOLHDL 4.0 02/19/2021 1538   LDLCALC 117 (H) 02/19/2021 1538   Home Medications   No outpatient medications have been marked as taking for the 03/15/21 encounter (Appointment) with Alver Sorrow, NP.     Review of Systems      All other systems reviewed and are otherwise negative except as noted above.  Physical Exam    VS:  LMP 02/13/2021  , BMI There is no height or weight on file to calculate BMI.  Wt Readings from Last 3 Encounters:  02/22/21 194 lb (88 kg)  02/19/21 193 lb 0.3 oz (87.6 kg)  02/14/21 189 lb 6 oz (85.9 kg)     GEN: Well nourished, well developed, in no acute distress. HEENT: normal. Neck: Supple,  no JVD, carotid bruits, or masses. Cardiac: ***RRR, no murmurs, rubs, or gallops. No clubbing, cyanosis, edema.  ***Radials/PT 2+ and equal bilaterally.  Respiratory:  ***Respirations regular and unlabored, clear to auscultation bilaterally. GI: Soft, nontender, nondistended. MS: No deformity or atrophy. Skin: Warm and dry, no rash. Neuro:  Strength and sensation are intact. Psych: Normal affect.  Assessment & Plan    HFrEF / NICM -   LV thrombus / Secondary hypercoagulable state -   IDA -    Disposition: Follow up {follow up:15908} with Dr. Cristal Deer or APP. May be rescheduled if seen by Advanced Heart Failure clinic sooner.   Signed, Alver Sorrow, NP 03/14/2021, 8:12 PM [facility]

## 2021-03-15 ENCOUNTER — Ambulatory Visit (HOSPITAL_BASED_OUTPATIENT_CLINIC_OR_DEPARTMENT_OTHER): Payer: Medicaid Other | Admitting: Family

## 2021-03-22 ENCOUNTER — Ambulatory Visit (INDEPENDENT_AMBULATORY_CARE_PROVIDER_SITE_OTHER): Payer: Self-pay | Admitting: Nurse Practitioner

## 2021-03-22 ENCOUNTER — Other Ambulatory Visit: Payer: Self-pay

## 2021-03-22 ENCOUNTER — Encounter: Payer: Self-pay | Admitting: Nurse Practitioner

## 2021-03-22 VITALS — BP 90/67 | HR 104 | Temp 97.1°F | Ht 59.0 in | Wt 180.0 lb

## 2021-03-22 DIAGNOSIS — I509 Heart failure, unspecified: Secondary | ICD-10-CM

## 2021-03-22 DIAGNOSIS — I513 Intracardiac thrombosis, not elsewhere classified: Secondary | ICD-10-CM

## 2021-03-22 DIAGNOSIS — Z130 Encounter for screening for diseases of the blood and blood-forming organs and certain disorders involving the immune mechanism: Secondary | ICD-10-CM

## 2021-03-22 DIAGNOSIS — Z7901 Long term (current) use of anticoagulants: Secondary | ICD-10-CM

## 2021-03-22 NOTE — Patient Instructions (Signed)
You were seen today in the [facility] for reevaluation of chronic illness. Labs were collected, results will be available via MyChart or, if abnormal, you will be contacted by clinic staff. Please follow up in 1 mth for reevaluation of INR.

## 2021-03-22 NOTE — Progress Notes (Signed)
[place], [place]  [address] Phone:  [phone]   Fax:  [phone] Subjective:   Patient ID: Anna [name], female    DOB: [birth year], 25 y.o.   MRN: [id number]  Chief Complaint  Patient presents with   Follow-up    1 month follow up;    HPI Anna [name] 25 y.o. female with history of CHF and LV mural thrombus to the [facility] for reevaluation of warfarin therapy and follow up s/p hospital admission.  Patient was readmitted to outside hospital on 9/26 after going to the ED for chest pain and shortness of breath. Admission resulted in repeat right heart cath. Since being discharged patient symptoms have completely resolved. She indicates that prior to second ED visit, she was not monitoring diet and now she makes more efforts to monitor sodium intake. States that she mostly eats chicken, fish, salad and yogurt. Attempts to walk daily, but still feels "winded" very quickly. Endorses weight loss since previous visit. Compliant with all medications. Coping well with new diagnosis, states, " I am just taking it one day at a time and going to church." Declines any counseling at this time. Moved from [place] and now permanently resides with mother.   Has follow up on 04/03/21 with new cardiologist closer to home. Denies any other complaints today. Denies any fatigue, chest pain, shortness of breath, HA or dizziness. Denies any blurred vision, numbness or tingling.    Past Medical History:  Diagnosis Date   Medical history non-contributory     Past Surgical History:  Procedure Laterality Date   RIGHT/LEFT HEART CATH AND CORONARY ANGIOGRAPHY N/A 02/07/2021   Procedure: RIGHT/LEFT HEART CATH AND CORONARY ANGIOGRAPHY;  Surgeon: Dixie Dials, MD;  Location: [place] [facility];  Service: Cardiovascular;  Laterality: N/A;    Family History  Problem Relation Age of Onset   Cancer Maternal Great-grandmother     Social History   Socioeconomic  History   Marital status: Unknown    Spouse name: Not on file   Number of children: Not on file   Years of education: Not on file   Highest education level: Not on file  Occupational History   Not on file  Tobacco Use   Smoking status: Never   Smokeless tobacco: Never  Substance and Sexual Activity   Alcohol use: Yes    Comment: drinks socially   Drug use: Never   Sexual activity: Not on file  Other Topics Concern   Not on file  Social History Narrative   Not on file   Social Determinants of Health   Financial Resource Strain: Not on file  Food Insecurity: Not on file  Transportation Needs: Not on file  Physical Activity: Not on file  Stress: Not on file  Social Connections: Not on file  Intimate Partner Violence: Not on file    Outpatient Medications Prior to Visit  Medication Sig Dispense Refill   Blood Pressure Monitoring (BLOOD PRESSURE CUFF) MISC Check blood pressure as directed 1 each 0   losartan (COZAAR) 25 MG tablet Take 12.5 mg by mouth 2 (two) times daily.     metoprolol succinate (TOPROL XL) 25 MG 24 hr tablet Take 0.5 tablets (12.5 mg total) by mouth daily. 45 tablet 3   spironolactone (ALDACTONE) 25 MG tablet Take 12.5 mg by mouth daily.     warfarin (COUMADIN) 1 MG tablet Take 1 mg by mouth daily.     warfarin (COUMADIN) 10 MG  tablet Take 1 tablet (10 mg total) by mouth daily. 30 tablet 1   No facility-administered medications prior to visit.    No Known Allergies  Review of Systems  Constitutional:  Negative for chills, fever and malaise/fatigue.  HENT: Negative.    Eyes: Negative.   Respiratory:  Positive for shortness of breath. Negative for cough.   Cardiovascular:  Positive for chest pain. Negative for palpitations and leg swelling.  Gastrointestinal:  Negative for abdominal pain, blood in stool, constipation, diarrhea, nausea and vomiting.  Musculoskeletal: Negative.   Skin: Negative.   Neurological: Negative.   Psychiatric/Behavioral:   Negative for depression. The patient is not nervous/anxious.   All other systems reviewed and are negative.     Objective:    Physical Exam Vitals reviewed.  Constitutional:      General: She is not in acute distress.    Appearance: Normal appearance.  HENT:     Head: Normocephalic.     Nose: Nose normal.  Eyes:     Extraocular Movements: Extraocular movements intact.     Conjunctiva/sclera: Conjunctivae normal.     Pupils: Pupils are equal, round, and reactive to light.  Cardiovascular:     Rate and Rhythm: Normal rate and regular rhythm.     Pulses: Normal pulses.     Heart sounds: Normal heart sounds.     Comments: No obvious peripheral edema Pulmonary:     Effort: Pulmonary effort is normal.     Breath sounds: Normal breath sounds.  Musculoskeletal:        General: No swelling. Normal range of motion.     Cervical back: Normal range of motion and neck supple.     Right lower leg: No edema.     Left lower leg: No edema.  Skin:    General: Skin is warm and dry.     Capillary Refill: Capillary refill takes less than 2 seconds.  Neurological:     General: No focal deficit present.     Mental Status: She is alert and oriented to person, place, and time.  Psychiatric:        Mood and Affect: Mood normal.        Behavior: Behavior normal.        Thought Content: Thought content normal.        Judgment: Judgment normal.    BP 90/67 (BP Location: Right Arm, Patient Position: Sitting)   Pulse (!) 104   Temp (!) 97.1 F (36.2 C)   Ht $R'4\' 11"'Nf$  (1.499 m)   Wt 180 lb (81.6 kg)   LMP 03/15/2021   SpO2 98%   BMI 36.36 kg/m  Wt Readings from Last 3 Encounters:  03/22/21 180 lb (81.6 kg)  02/22/21 194 lb (88 kg)  02/19/21 193 lb 0.3 oz (87.6 kg)     There is no immunization history on file for this patient.  Diabetic Foot Exam - Simple   No data filed     Lab Results  Component Value Date   TSH 1.950 02/05/2021   Lab Results  Component Value Date   WBC 3.8  03/22/2021   HGB 12.0 03/22/2021   HCT 36.0 03/22/2021   MCV 86 03/22/2021   PLT 207 03/22/2021   Lab Results  Component Value Date   NA 141 02/19/2021   K 4.2 02/19/2021   CO2 22 02/19/2021   GLUCOSE 98 02/19/2021   BUN 8 02/19/2021   CREATININE 1.02 (H) 02/19/2021   BILITOT 0.3 02/19/2021  ALKPHOS 64 02/19/2021   AST 18 02/19/2021   ALT 22 02/19/2021   PROT 7.2 02/19/2021   ALBUMIN 4.2 02/19/2021   CALCIUM 9.0 02/19/2021   ANIONGAP 10 02/14/2021   EGFR 78 02/19/2021   Lab Results  Component Value Date   CHOL 169 02/19/2021   Lab Results  Component Value Date   HDL 42 02/19/2021   Lab Results  Component Value Date   LDLCALC 117 (H) 02/19/2021   Lab Results  Component Value Date   TRIG 50 02/19/2021   Lab Results  Component Value Date   CHOLHDL 4.0 02/19/2021   Lab Results  Component Value Date   HGBA1C 5.2 02/19/2021       Assessment & Plan:   Problem List Items Addressed This Visit       Cardiovascular and Mediastinum   New onset of congestive heart failure (HCC)   Relevant Medications   warfarin (COUMADIN) 1 MG tablet   losartan (COZAAR) 25 MG tablet   spironolactone (ALDACTONE) 25 MG tablet Encouraged continued diet and exercise efforts  Encouraged continued compliance with medication  Informed to maintain follow up with cardiologist on 04/03/21   LV (left ventricular) mural thrombus   Relevant Medications   warfarin (COUMADIN) 1 MG tablet   losartan (COZAAR) 25 MG tablet   spironolactone (ALDACTONE) 25 MG tablet   Other Visit Diagnoses     On warfarin therapy    -  Primary   Relevant Orders   Protime-INR (Completed) Will adjust coumadin based on INR results   Screening for deficiency anemia       Relevant Orders   CBC with Differential/Platelet (Completed)   Iron, TIBC and Ferritin Panel (Completed)   Follow up in 1 mth or sooner, based on INR results    I am having Ren Cedrone maintain her metoprolol succinate, Blood Pressure  Cuff, warfarin, losartan, and spironolactone.  No orders of the defined types were placed in this encounter.    Teena Dunk, NP

## 2021-03-23 LAB — IRON,TIBC AND FERRITIN PANEL
Ferritin: 118 ng/mL (ref 15–150)
Iron Saturation: 23 % (ref 15–55)
Iron: 68 ug/dL (ref 27–159)
Total Iron Binding Capacity: 296 ug/dL (ref 250–450)
UIBC: 228 ug/dL (ref 131–425)

## 2021-03-23 LAB — CBC WITH DIFFERENTIAL/PLATELET
Basophils Absolute: 0 10*3/uL (ref 0.0–0.2)
Basos: 1 %
EOS (ABSOLUTE): 0.1 10*3/uL (ref 0.0–0.4)
Eos: 2 %
Hematocrit: 36 % (ref 34.0–46.6)
Hemoglobin: 12 g/dL (ref 11.1–15.9)
Immature Grans (Abs): 0 10*3/uL (ref 0.0–0.1)
Immature Granulocytes: 0 %
Lymphocytes Absolute: 1.9 10*3/uL (ref 0.7–3.1)
Lymphs: 48 %
MCH: 28.6 pg (ref 26.6–33.0)
MCHC: 33.3 g/dL (ref 31.5–35.7)
MCV: 86 fL (ref 79–97)
Monocytes Absolute: 0.4 10*3/uL (ref 0.1–0.9)
Monocytes: 11 %
Neutrophils Absolute: 1.5 10*3/uL (ref 1.4–7.0)
Neutrophils: 38 %
Platelets: 207 10*3/uL (ref 150–450)
RBC: 4.19 x10E6/uL (ref 3.77–5.28)
RDW: 16.7 % — ABNORMAL HIGH (ref 11.7–15.4)
WBC: 3.8 10*3/uL (ref 3.4–10.8)

## 2021-03-23 LAB — PROTIME-INR
INR: 1.1 (ref 0.9–1.2)
Prothrombin Time: 11.6 s (ref 9.1–12.0)

## 2021-03-25 ENCOUNTER — Encounter (HOSPITAL_BASED_OUTPATIENT_CLINIC_OR_DEPARTMENT_OTHER): Payer: Self-pay | Admitting: Cardiology

## 2021-04-01 ENCOUNTER — Other Ambulatory Visit: Payer: Self-pay

## 2021-04-01 DIAGNOSIS — Z7901 Long term (current) use of anticoagulants: Secondary | ICD-10-CM

## 2021-04-02 ENCOUNTER — Other Ambulatory Visit: Payer: Self-pay | Admitting: Nurse Practitioner

## 2021-04-02 DIAGNOSIS — Z7901 Long term (current) use of anticoagulants: Secondary | ICD-10-CM

## 2021-04-02 LAB — APTT: aPTT: 29 s (ref 24–33)

## 2021-04-02 LAB — PROTIME-INR
INR: 1.1 (ref 0.9–1.2)
Prothrombin Time: 11.6 s (ref 9.1–12.0)

## 2021-04-03 ENCOUNTER — Encounter (HOSPITAL_COMMUNITY): Payer: Medicaid Other | Admitting: Internal Medicine

## 2021-04-04 ENCOUNTER — Encounter (HOSPITAL_COMMUNITY): Payer: Medicaid Other | Admitting: Internal Medicine

## 2021-04-11 ENCOUNTER — Other Ambulatory Visit: Payer: Medicaid Other

## 2021-04-19 ENCOUNTER — Other Ambulatory Visit: Payer: Medicaid Other

## 2023-02-13 IMAGING — CT CT ANGIO CHEST
2 of 6 series · 18 of 36 positions shown · IV contrast (omnipaque)
Comparison: Chest x-ray from same day.

CLINICAL DATA: Cough and shortness of breath for the past 4-5 days.
Elevated D-dimer.

EXAM:
CT ANGIOGRAPHY CHEST WITH CONTRAST
TECHNIQUE: Multidetector CT imaging of the chest was performed using the
standard protocol during bolus administration of intravenous
contrast. Multiplanar CT image reconstructions and MIPs were
obtained to evaluate the vascular anatomy.
CONTRAST:  100mL OMNIPAQUE IOHEXOL 350 MG/ML SOLN

[Series 6: thins · axial · 0.62mm/px · z∈[+1518,+1752]mm · 17 of 263 slices shown]
[im 15/263  lung]
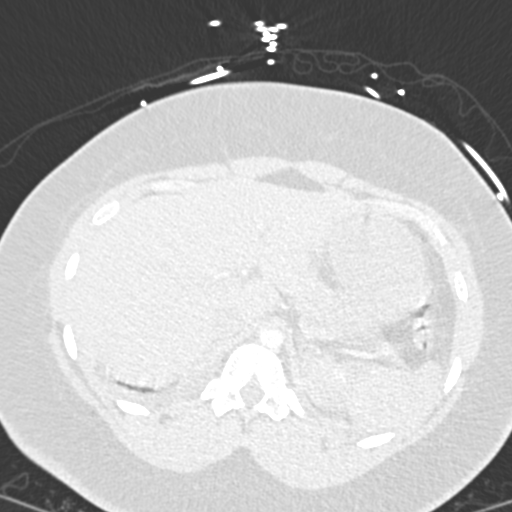
[im 30/263  mediastinal]
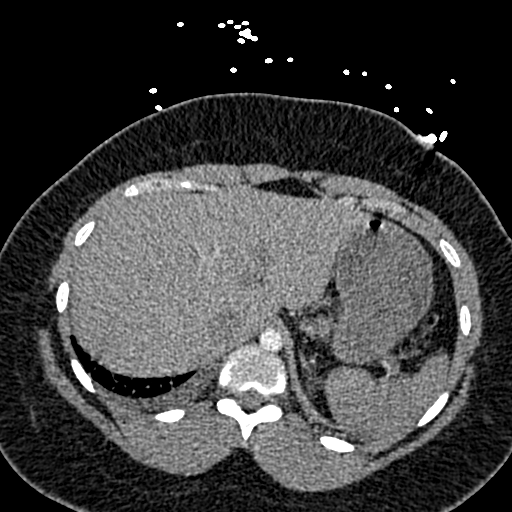
[im 44/263  lung]
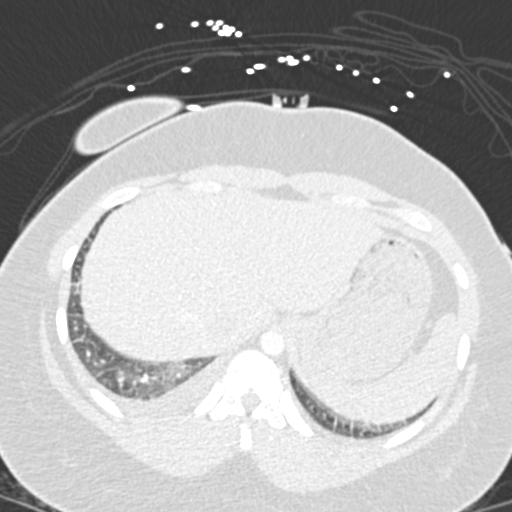
[im 59/263  mediastinal]
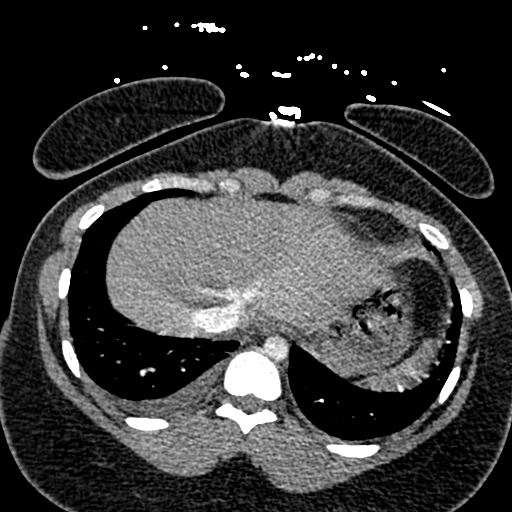
[im 73/263  lung]
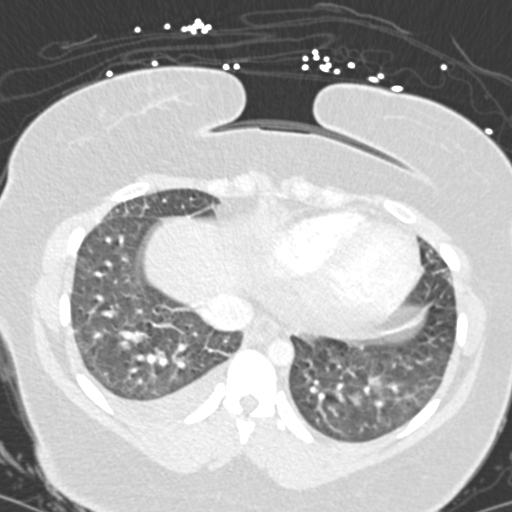
[im 88/263  mediastinal]
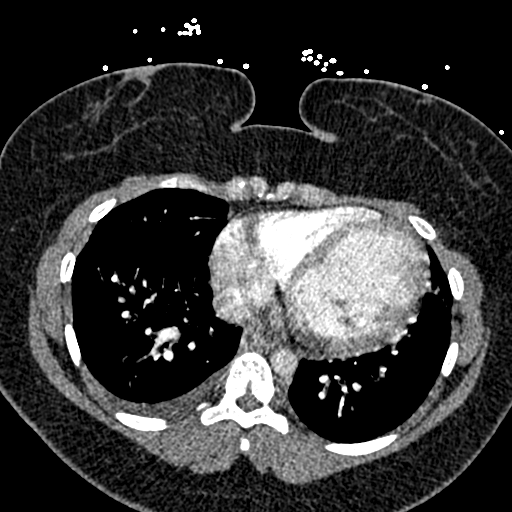
[im 102/263  lung]
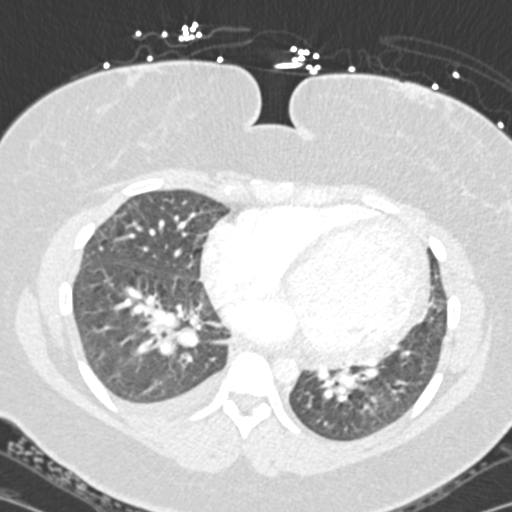
[im 117/263  mediastinal]
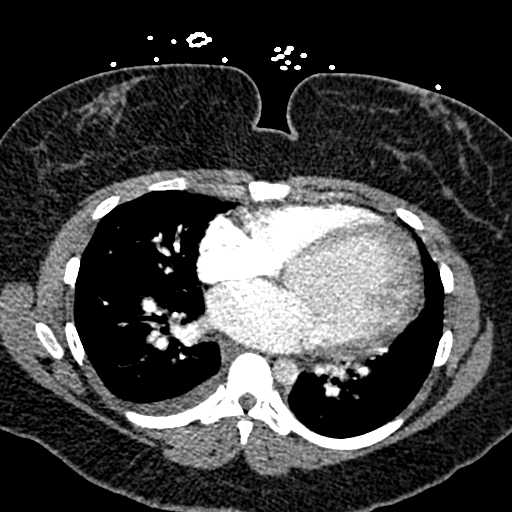
[im 132/263  lung]
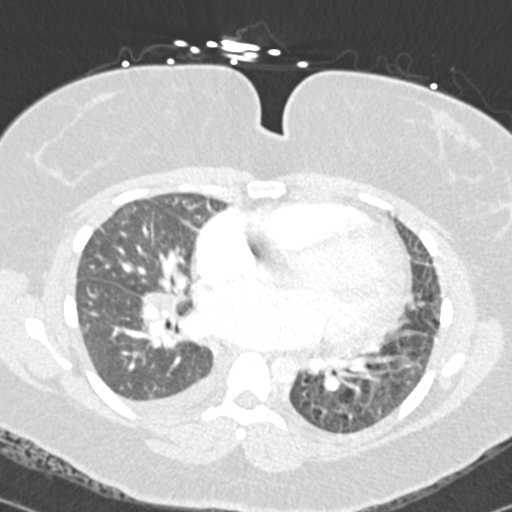
[im 146/263  mediastinal]
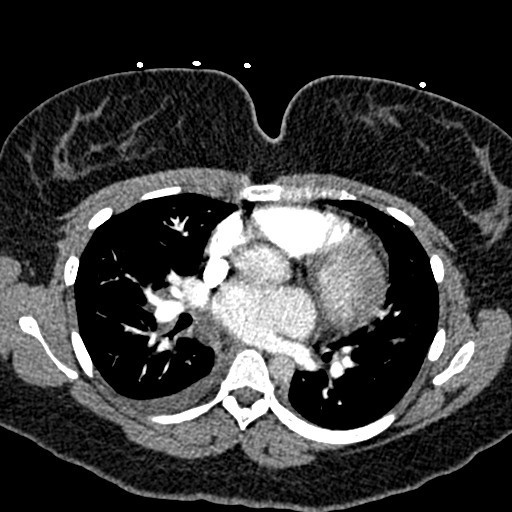
[im 161/263  lung]
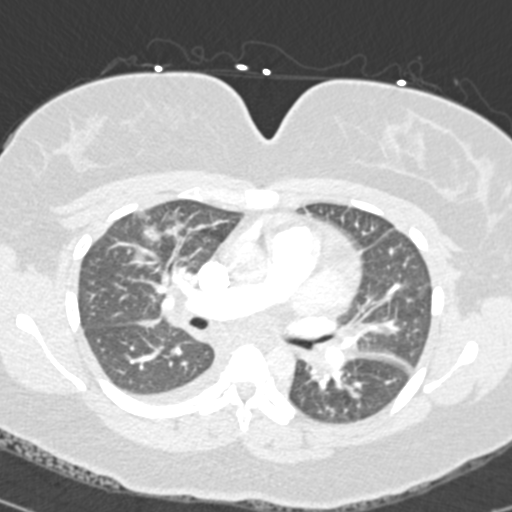
[im 175/263  mediastinal]
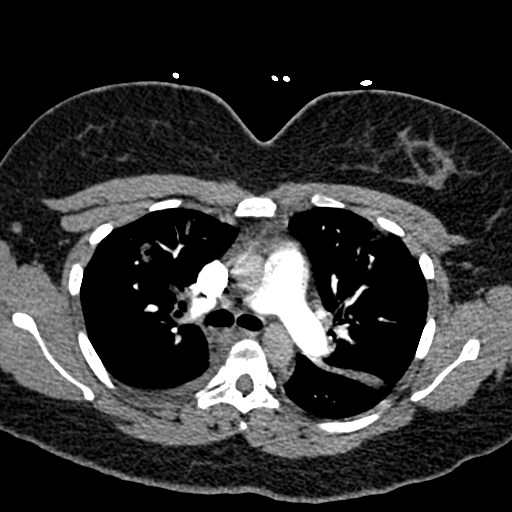
[im 190/263  lung]
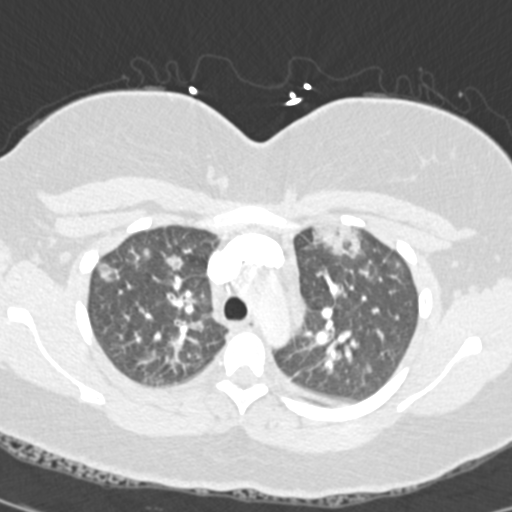
[im 204/263  mediastinal]
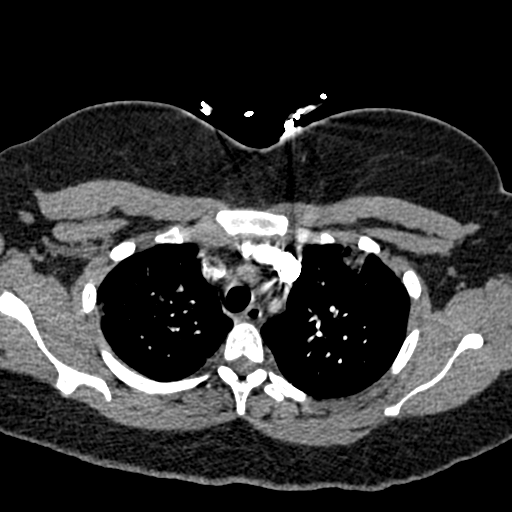
[im 219/263  lung]
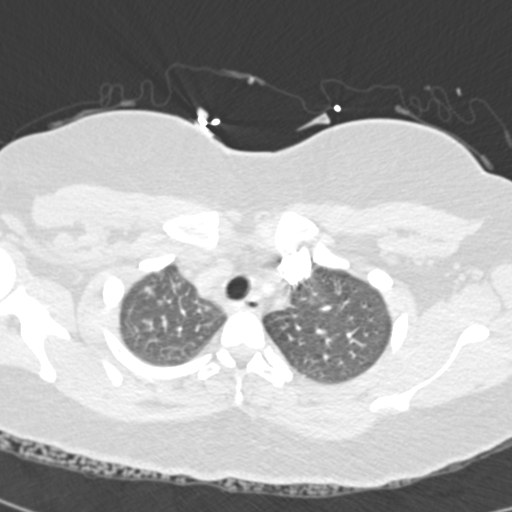
[im 233/263  mediastinal]
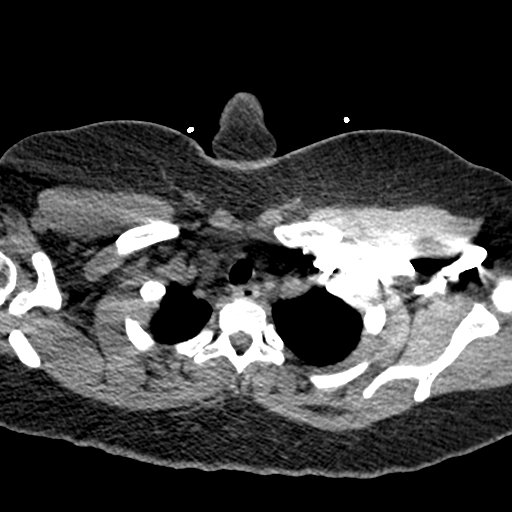
[im 248/263  lung]
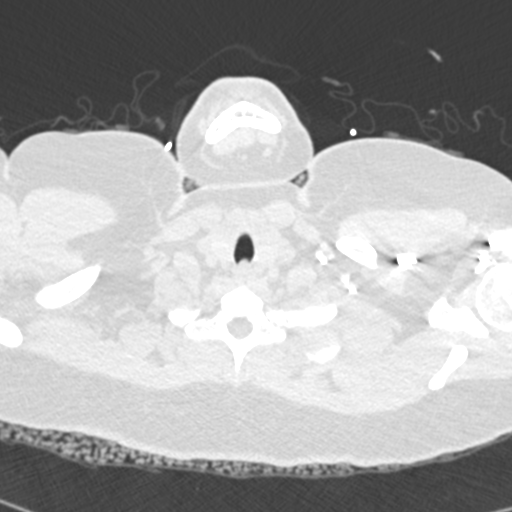

[Series 8: coronal mpr · coronal · 0.51mm/px · 1 of 126 slices shown]
[im 63/126  mediastinal]
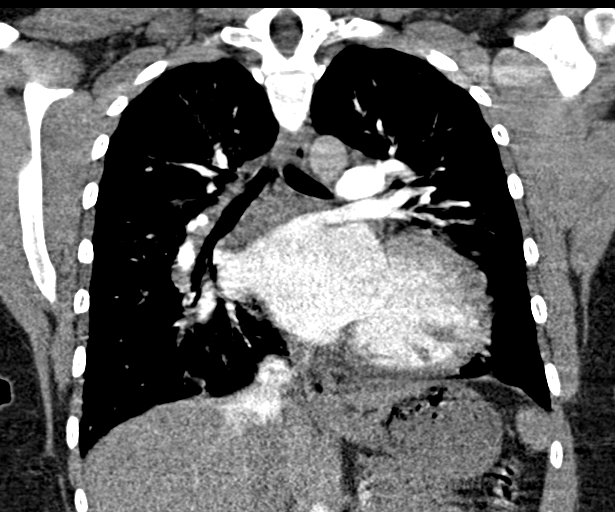

[18 of 36 positions shown; findings below may reference images not displayed]

FINDINGS: Cardiovascular: Satisfactory opacification of the pulmonary arteries
to the segmental level. No evidence of pulmonary embolism.
Cardiomegaly. No pericardial effusion. No thoracic aortic aneurysm.

Mediastinum/Nodes: No enlarged mediastinal, hilar, or axillary lymph
nodes. Thyroid gland, trachea, and esophagus demonstrate no
significant findings.

Lungs/Pleura: Scattered smooth interlobular septal thickening.
Patchy irregular ground-glass densities in both upper lobes, and to
a lesser extent in the right lower lobe. Small right and trace left
pleural effusions. Small amount of fluid along the left major
fissure. No pneumothorax.

Upper Abdomen: No acute abnormality.

Musculoskeletal: No chest wall abnormality. No acute or significant
osseous findings.

Review of the MIP images confirms the above findings.
IMPRESSION: 1. No evidence of pulmonary embolism.
2. Congestive heart failure. Mild interstitial and alveolar
pulmonary edema with small right and trace left pleural effusions.
